# Patient Record
Sex: Male | Born: 1955
Health system: Southern US, Community
[De-identification: ages and names within clinical notes are randomized; demographics above are authoritative.]

## PROBLEM LIST (undated history)

## (undated) DIAGNOSIS — K8020 Calculus of gallbladder without cholecystitis without obstruction: Secondary | ICD-10-CM

## (undated) DIAGNOSIS — E039 Hypothyroidism, unspecified: Secondary | ICD-10-CM

## (undated) DIAGNOSIS — K589 Irritable bowel syndrome without diarrhea: Secondary | ICD-10-CM

## (undated) DIAGNOSIS — R002 Palpitations: Secondary | ICD-10-CM

## (undated) DIAGNOSIS — E7800 Pure hypercholesterolemia, unspecified: Secondary | ICD-10-CM

## (undated) DIAGNOSIS — I493 Ventricular premature depolarization: Secondary | ICD-10-CM

## (undated) DIAGNOSIS — D1800 Hemangioma unspecified site: Secondary | ICD-10-CM

## (undated) DIAGNOSIS — I4891 Unspecified atrial fibrillation: Secondary | ICD-10-CM

## (undated) DIAGNOSIS — M354 Diffuse (eosinophilic) fasciitis: Secondary | ICD-10-CM

## (undated) DIAGNOSIS — H8110 Benign paroxysmal vertigo, unspecified ear: Secondary | ICD-10-CM

## (undated) DIAGNOSIS — K219 Gastro-esophageal reflux disease without esophagitis: Secondary | ICD-10-CM

## (undated) DIAGNOSIS — D7218 Eosinophilia in diseases classified elsewhere: Secondary | ICD-10-CM

## (undated) DIAGNOSIS — R42 Dizziness and giddiness: Secondary | ICD-10-CM

## (undated) HISTORY — DX: Benign paroxysmal vertigo, unspecified ear: H81.10

## (undated) HISTORY — DX: Ventricular premature depolarization: I49.3

## (undated) HISTORY — DX: Pure hypercholesterolemia, unspecified: E78.00

## (undated) HISTORY — DX: Gilbert syndrome: E80.4

## (undated) HISTORY — DX: Hypothyroidism, unspecified: E03.9

## (undated) HISTORY — DX: Palpitations: R00.2

## (undated) HISTORY — DX: Eosinophilia in diseases classified elsewhere: D72.18

## (undated) HISTORY — PX: CHOLECYSTECTOMY: SHX55

## (undated) HISTORY — PX: BACK SURGERY: SHX140

## (undated) HISTORY — DX: Gastro-esophageal reflux disease without esophagitis: K21.9

## (undated) HISTORY — DX: Calculus of gallbladder without cholecystitis without obstruction: K80.20

## (undated) HISTORY — DX: Hemangioma unspecified site: D18.00

## (undated) HISTORY — DX: Unspecified atrial fibrillation: I48.91

## (undated) HISTORY — DX: Irritable bowel syndrome, unspecified: K58.9

## (undated) HISTORY — DX: Diffuse (eosinophilic) fasciitis: M35.4

## (undated) HISTORY — DX: Dizziness and giddiness: R42

## (undated) HISTORY — PX: COLONOSCOPY: SHX174

---

## 1994-01-09 DIAGNOSIS — D1800 Hemangioma unspecified site: Secondary | ICD-10-CM

## 1994-01-09 HISTORY — DX: Hemangioma unspecified site: D18.00

## 1997-06-16 ENCOUNTER — Ambulatory Visit (HOSPITAL_COMMUNITY): Admission: RE | Admit: 1997-06-16 | Discharge: 1997-06-16 | Payer: Self-pay | Admitting: Otolaryngology

## 1997-10-01 ENCOUNTER — Ambulatory Visit (HOSPITAL_COMMUNITY): Admission: RE | Admit: 1997-10-01 | Discharge: 1997-10-01 | Payer: Self-pay | Admitting: *Deleted

## 1998-11-04 ENCOUNTER — Observation Stay (HOSPITAL_COMMUNITY): Admission: RE | Admit: 1998-11-04 | Discharge: 1998-11-06 | Payer: Self-pay | Admitting: Specialist

## 1998-11-04 ENCOUNTER — Encounter (INDEPENDENT_AMBULATORY_CARE_PROVIDER_SITE_OTHER): Payer: Self-pay | Admitting: Specialist

## 1998-11-04 ENCOUNTER — Encounter: Payer: Self-pay | Admitting: Specialist

## 2002-10-24 ENCOUNTER — Ambulatory Visit (HOSPITAL_COMMUNITY): Admission: RE | Admit: 2002-10-24 | Discharge: 2002-10-24 | Payer: Self-pay | Admitting: Otolaryngology

## 2002-10-24 ENCOUNTER — Encounter: Payer: Self-pay | Admitting: Otolaryngology

## 2009-05-27 DIAGNOSIS — E039 Hypothyroidism, unspecified: Secondary | ICD-10-CM | POA: Insufficient documentation

## 2010-01-25 ENCOUNTER — Encounter
Admission: RE | Admit: 2010-01-25 | Discharge: 2010-01-25 | Payer: Self-pay | Source: Home / Self Care | Attending: Otolaryngology | Admitting: Otolaryngology

## 2010-04-12 DIAGNOSIS — H8110 Benign paroxysmal vertigo, unspecified ear: Secondary | ICD-10-CM | POA: Insufficient documentation

## 2011-04-10 ENCOUNTER — Encounter: Payer: Self-pay | Admitting: *Deleted

## 2011-06-20 ENCOUNTER — Encounter: Payer: Self-pay | Admitting: Cardiology

## 2011-08-29 ENCOUNTER — Other Ambulatory Visit: Payer: Self-pay | Admitting: Orthopedic Surgery

## 2011-08-30 ENCOUNTER — Other Ambulatory Visit: Payer: Self-pay | Admitting: Orthopedic Surgery

## 2011-08-31 ENCOUNTER — Other Ambulatory Visit: Payer: Self-pay | Admitting: Orthopedic Surgery

## 2011-12-05 ENCOUNTER — Encounter (INDEPENDENT_AMBULATORY_CARE_PROVIDER_SITE_OTHER): Payer: BC Managed Care – PPO | Admitting: Ophthalmology

## 2011-12-05 DIAGNOSIS — H2510 Age-related nuclear cataract, unspecified eye: Secondary | ICD-10-CM

## 2011-12-05 DIAGNOSIS — H43819 Vitreous degeneration, unspecified eye: Secondary | ICD-10-CM

## 2012-05-29 ENCOUNTER — Encounter (HOSPITAL_COMMUNITY): Payer: Self-pay | Admitting: Family Medicine

## 2012-05-29 ENCOUNTER — Emergency Department (HOSPITAL_COMMUNITY)
Admission: EM | Admit: 2012-05-29 | Discharge: 2012-05-30 | Disposition: A | Discharge status: Home / Self Care | Payer: BC Managed Care – PPO | Attending: Emergency Medicine | Admitting: Emergency Medicine

## 2012-05-29 DIAGNOSIS — Z862 Personal history of diseases of the blood and blood-forming organs and certain disorders involving the immune mechanism: Secondary | ICD-10-CM | POA: Insufficient documentation

## 2012-05-29 DIAGNOSIS — Z8679 Personal history of other diseases of the circulatory system: Secondary | ICD-10-CM | POA: Insufficient documentation

## 2012-05-29 DIAGNOSIS — Z79899 Other long term (current) drug therapy: Secondary | ICD-10-CM | POA: Insufficient documentation

## 2012-05-29 DIAGNOSIS — R509 Fever, unspecified: Secondary | ICD-10-CM

## 2012-05-29 DIAGNOSIS — R51 Headache: Secondary | ICD-10-CM

## 2012-05-29 DIAGNOSIS — E039 Hypothyroidism, unspecified: Secondary | ICD-10-CM | POA: Insufficient documentation

## 2012-05-29 DIAGNOSIS — Z8639 Personal history of other endocrine, nutritional and metabolic disease: Secondary | ICD-10-CM | POA: Insufficient documentation

## 2012-05-29 LAB — GRAM STAIN

## 2012-05-29 LAB — CSF CELL COUNT WITH DIFFERENTIAL: Tube #: 1

## 2012-05-29 LAB — CRYPTOCOCCAL ANTIGEN, CSF

## 2012-05-29 MED ORDER — ONDANSETRON 4 MG PO TBDP
4.0000 mg | ORAL_TABLET | Freq: Once | ORAL | Status: AC
  Administered 2012-05-29: 4 mg via ORAL
  Filled 2012-05-29: qty 1

## 2012-05-29 MED ORDER — KETOROLAC TROMETHAMINE 30 MG/ML IJ SOLN
30.0000 mg | Freq: Once | INTRAMUSCULAR | Status: AC
  Administered 2012-05-29: 30 mg via INTRAVENOUS
  Filled 2012-05-29: qty 1

## 2012-05-29 MED ORDER — NAPROXEN 500 MG PO TABS: 500.0000 mg | ORAL_TABLET | Freq: Two times a day (BID) | ORAL | Status: DC

## 2012-05-29 MED ORDER — ONDANSETRON HCL 4 MG/2ML IJ SOLN
4.0000 mg | Freq: Once | INTRAMUSCULAR | Status: AC
  Administered 2012-05-29: 4 mg via INTRAVENOUS
  Filled 2012-05-29: qty 2

## 2012-05-29 MED ORDER — SODIUM CHLORIDE 0.9 % IV BOLUS (SEPSIS)
1000.0000 mL | Freq: Once | INTRAVENOUS | Status: AC
  Administered 2012-05-29: 1000 mL via INTRAVENOUS

## 2012-05-29 MED ORDER — MORPHINE SULFATE 4 MG/ML IJ SOLN
8.0000 mg | Freq: Once | INTRAMUSCULAR | Status: AC
  Administered 2012-05-29: 8 mg via INTRAVENOUS
  Filled 2012-05-29: qty 2

## 2012-05-29 MED ORDER — METOCLOPRAMIDE HCL 5 MG/ML IJ SOLN
10.0000 mg | Freq: Once | INTRAMUSCULAR | Status: AC
  Administered 2012-05-29: 10 mg via INTRAVENOUS
  Filled 2012-05-29: qty 2

## 2012-05-29 MED ORDER — DIPHENHYDRAMINE HCL 50 MG/ML IJ SOLN
25.0000 mg | Freq: Once | INTRAMUSCULAR | Status: AC
  Administered 2012-05-29: 25 mg via INTRAVENOUS
  Filled 2012-05-29: qty 1

## 2012-05-29 MED ORDER — LIDOCAINE-EPINEPHRINE 2 %-1:100000 IJ SOLN
20.0000 mL | INTRAMUSCULAR | Status: DC
  Filled 2012-05-29: qty 20

## 2012-05-29 MED ORDER — FENTANYL CITRATE 0.05 MG/ML IJ SOLN
100.0000 ug | Freq: Once | INTRAMUSCULAR | Status: AC
  Administered 2012-05-29: 100 ug via INTRAVENOUS
  Filled 2012-05-29: qty 2

## 2012-05-29 NOTE — ED Notes (Signed)
PT spinal fluid samples collected BY DR Hyacinth Meeker and labled BY DR Hyacinth Meeker.

## 2012-05-29 NOTE — ED Notes (Signed)
Per pt and family pt has been having HA, Nausea. sts sent here by doctor to r/o meningitis. Photophobia, fever, joint pain. sts very weak.

## 2012-05-29 NOTE — ED Notes (Signed)
Pt feeling nauseated. Pt states he refusing any med to help with nausea. Pt will sit before discharge to make sure he is feelin better due to the LP he received. Saltine crackers given to help with stomach.

## 2012-05-29 NOTE — ED Provider Notes (Signed)
History     CSN: [id number]  Arrival date & time 05/29/12  2595   First MD Initiated Contact with Patient 05/29/12 1910      Chief Complaint  Patient presents with  . 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 This started approximately 5 days ago and has been gradually worsening, associated with joint pains and diffuse weakness. The patient admits to being an avid outdoors min and last one-approximately one month ago. He denies any obvious insect bites including ticks or mosquitoes and has had no rashes. He denies dysuria, diarrhea, respiratory symptoms or sinus symptoms. His headache is diffuse, burning, associated with photophobia. He has never had any significant headaches in the past. He had laboratory work done at his family doctor's office today with a white blood cell count of 3.9, normal electrolytes and renal function and a CT scan of the head did not show any significant abnormalities. He was sent to the hospital to rule out meningitis.  Patient is a 57 y.o. male presenting with headaches. The history is provided by the patient.  Headache   Past Medical History  Diagnosis Date  . PVC's (premature ventricular contractions)     positional  . Hypothyroidism   . Hypercholesteremia     Past Surgical History  Procedure Laterality Date  . Back surgery  1996 & 1999    Family History  Problem Relation Age of Onset  . Heart disease    . Hypertension    . Heart attack      History  Substance Use Topics  . Smoking status: Never Smoker   . Smokeless tobacco: Not on file  . Alcohol Use: No      Review of Systems  Neurological: Positive for headaches.  All other systems reviewed and are negative.    Allergies  Review of patient's allergies  indicates no known allergies.  Home Medications   Current Outpatient Rx  Name  Route  Sig  Dispense  Refill  . doxycycline (VIBRA-TABS) 100 MG tablet   Oral   Take 100 mg by mouth 2 (two) times daily.         Marland Kitchen ibuprofen (ADVIL,MOTRIN) 200 MG tablet   Oral   Take 600 mg by mouth every 6 (six) hours as needed for pain.         Marland Kitchen levothyroxine (SYNTHROID, LEVOTHROID) 75 MCG tablet   Oral   Take 75 mcg by mouth daily before breakfast.         . pseudoephedrine (SUDAFED) 30 MG tablet   Oral   Take 30 mg by mouth every 4 (four) hours as needed for congestion.         . naproxen (NAPROSYN) 500 MG tablet   Oral   Take 1 tablet (500 mg total) by mouth 2 (two) times daily with a meal.   30 tablet   0     BP 143/87  Pulse 83  Temp(Src) 99.9 F (37.7 C)  Resp 9  SpO2 94%  Physical Exam  Nursing note and vitals reviewed. Constitutional: He appears well-developed and well-nourished.  Uncomfortable appearing  HENT:  Head: Normocephalic and atraumatic.  Mouth/Throat: Oropharynx is clear and moist. No oropharyngeal exudate.  Oropharynx is clear, nasal passages are clear, tympanic membranes are clear.  Sinuses are nontender  Eyes: Conjunctivae and EOM are normal. Pupils  are equal, round, and reactive to light. Right eye exhibits no discharge. Left eye exhibits no discharge. No scleral icterus.  Neck: Normal range of motion. No JVD present. No thyromegaly present.  Pain with range of motion but able to look over each shoulder. No pain with flexion  Cardiovascular: Normal rate, regular rhythm, normal heart sounds and intact distal pulses.  Exam reveals no gallop and no friction rub.   No murmur heard. Pulmonary/Chest: Effort normal and breath sounds normal. No respiratory distress. He has no wheezes. He has no rales.  Abdominal: Soft. Bowel sounds are normal. He exhibits no distension and no mass. There is no tenderness.  Musculoskeletal: Normal range of motion. He exhibits  no edema and no tenderness.  Lymphadenopathy:    He has no cervical adenopathy.  Neurological: He is alert. Coordination normal.  Mental status is normal, speech is clear, gait is normal, movements are intact with normal coordination and no ataxia  Skin: Skin is warm and dry. No rash noted. No erythema.  Psychiatric: He has a normal mood and affect. His behavior is normal.    ED Course  LUMBAR PUNCTURE Date/Time: 05/29/2012 8:41 PM Performed by: Eber Hong D Authorized by: Eber Hong D Consent: Verbal consent obtained. written consent obtained. Risks and benefits: risks, benefits and alternatives were discussed Consent given by: patient Patient understanding: patient states understanding of the procedure being performed Patient consent: the patient's understanding of the procedure matches consent given Procedure consent: procedure consent matches procedure scheduled Relevant documents: relevant documents present and verified Test results: test results available and properly labeled Site marked: the operative site was marked Imaging studies: imaging studies available Required items: required blood products, implants, devices, and special equipment available Patient identity confirmed: verbally with patient Time out: Immediately prior to procedure a "time out" was called to verify the correct patient, procedure, equipment, support staff and site/side marked as required. Indications: evaluation for infection Anesthesia: local infiltration Local anesthetic: lidocaine 2% with epinephrine Anesthetic total: 6 ml Patient sedated: no Preparation: Patient was prepped and draped in the usual sterile fashion. Lumbar space: L4-L5 interspace Patient's position: left lateral decubitus Needle gauge: 22 Needle type: spinal needle - Quincke tip Needle length: 3.5 in Number of attempts: 1 Fluid appearance: clear Tubes of fluid: 4 Total volume: 4 ml Post-procedure: site cleaned and  adhesive bandage applied Patient tolerance: Patient tolerated the procedure well with no immediate complications.   (including critical care time)  Labs Reviewed  CSF CELL COUNT WITH DIFFERENTIAL - Abnormal; Notable for the following:    RBC Count, CSF 14 (*)    All other components within normal limits  GRAM STAIN  CSF CULTURE  CRYPTOCOCCAL ANTIGEN, CSF  PROTEIN AND GLUCOSE, CSF  ARBOVIRUS IGG, CSF  HERPES SIMPLEX VIRUS(HSV) DNA BY PCR   No results found.   1. Headache   2. Fever       MDM  At this time the patient has a normal neurologic exam, normal HEENT exam, vital signs are normal, temperature 99.9 degrees, no tachycardia. He does appear uncomfortable and will require spinal tap for further evaluation. Titers of rickettsial diseases were ordered at his family doctor's office and drawn already. He was prescribed Vicodin and doxycycline, he has not had these yet.   Lumbar puncture performed, testing not consistent with meningitis, patient appears stable for discharge after receiving intravenous medications and significant improvement. The patient will be discharged and informed that he needs to continue his doxycycline, Naprosyn given as a  prescription to use in addition to his hydrocodone, spouse expresses understanding.   Meds given in ED:  Medications  lidocaine-EPINEPHrine (XYLOCAINE W/EPI) 2 %-1:100000 (with pres) injection 20 mL (not administered)  fentaNYL (SUBLIMAZE) injection 100 mcg (100 mcg Intravenous Given 05/29/12 2035)  ondansetron (ZOFRAN) injection 4 mg (4 mg Intravenous Given 05/29/12 2032)  diphenhydrAMINE (BENADRYL) injection 25 mg (25 mg Intravenous Given 05/29/12 2140)  morphine 4 MG/ML injection 8 mg (8 mg Intravenous Given 05/29/12 2140)  ondansetron (ZOFRAN-ODT) disintegrating tablet 4 mg (4 mg Oral Given 05/29/12 2140)  sodium chloride 0.9 % bolus 1,000 mL (1,000 mLs Intravenous New Bag/Given 05/29/12 2142)  ketorolac (TORADOL) 30 MG/ML injection 30  mg (30 mg Intravenous Given 05/29/12 2243)  metoCLOPramide (REGLAN) injection 10 mg (10 mg Intravenous Given 05/29/12 2243)    New Prescriptions   NAPROXEN (NAPROSYN) 500 MG TABLET    Take 1 tablet (500 mg total) by mouth 2 (two) times daily with a meal.      Vida Roller, MD 05/29/12 2344

## 2012-05-30 LAB — HERPES SIMPLEX VIRUS(HSV) DNA BY PCR
HSV 1 DNA: NOT DETECTED
HSV 2 DNA: NOT DETECTED

## 2012-05-30 NOTE — ED Notes (Signed)
Pt states he is feeling better after vomitingx1. Pt states he is ready to be discharged. Wife at bedside. Pt stable

## 2012-05-31 LAB — ARBOVIRUS IGG, CSF
Eastern eq encephalitis, IgG: <1:2 {titer}
Lacrosse virus encephalitis, IgG: <1:2 {titer}
St Louis encephalitis, IgG: <1:2 {titer}
Western eq encephalitis, IgG: <1:2 {titer}

## 2012-06-02 LAB — CSF CULTURE W GRAM STAIN

## 2013-09-23 ENCOUNTER — Encounter: Payer: Self-pay | Admitting: Cardiology

## 2014-01-09 DIAGNOSIS — K8020 Calculus of gallbladder without cholecystitis without obstruction: Secondary | ICD-10-CM

## 2014-01-09 HISTORY — DX: Calculus of gallbladder without cholecystitis without obstruction: K80.20

## 2014-06-19 DIAGNOSIS — R109 Unspecified abdominal pain: Secondary | ICD-10-CM | POA: Insufficient documentation

## 2014-10-27 DIAGNOSIS — R197 Diarrhea, unspecified: Secondary | ICD-10-CM | POA: Insufficient documentation

## 2015-02-04 MED FILL — LEVOTHYROXINE 75 MCG TABLET: 75 | 30 days supply | Qty: 30 | Fill #0

## 2015-03-09 MED FILL — LEVOTHYROXINE 75 MCG TABLET: 75 | 30 days supply | Qty: 30 | Fill #1

## 2015-04-13 MED FILL — LEVOTHYROXINE 75 MCG TABLET: 75 | 30 days supply | Qty: 30 | Fill #2

## 2015-04-20 MED FILL — DOXYCYCLINE HYCLATE 100 MG: 100 | 7 days supply | Qty: 14 | Fill #0

## 2015-05-11 MED FILL — LEVOTHYROXINE 75 MCG TABLET: 75 | 30 days supply | Qty: 30 | Fill #3

## 2015-06-10 MED FILL — LEVOTHYROXINE 75 MCG TABLET: 75 | 30 days supply | Qty: 30 | Fill #4

## 2015-07-12 MED FILL — LEVOTHYROXINE 75 MCG TABLET: 75 | 30 days supply | Qty: 30 | Fill #5

## 2015-08-10 MED FILL — LEVOTHYROXINE 75 MCG TABLET: 75 | 30 days supply | Qty: 30 | Fill #6

## 2015-09-09 MED FILL — LEVOTHYROXINE 75 MCG TABLET: 75 | 30 days supply | Qty: 30 | Fill #7

## 2015-10-08 MED FILL — LEVOTHYROXINE 75 MCG TABLET: 75 | 30 days supply | Qty: 30 | Fill #8

## 2015-11-09 MED FILL — LEVOTHYROXINE 75 MCG TABLET: 75 | 30 days supply | Qty: 30 | Fill #9

## 2015-12-09 MED FILL — LEVOTHYROXINE 75 MCG TABLET: 75 | 30 days supply | Qty: 30 | Fill #10

## 2016-01-07 MED FILL — LEVOTHYROXINE 75 MCG TABLET: 75 | 30 days supply | Qty: 30 | Fill #11

## 2016-02-07 MED FILL — LEVOTHYROXINE 75 MCG TABLET: 75 | 31 days supply | Qty: 31 | Fill #0

## 2016-03-09 MED FILL — LEVOTHYROXINE 75 MCG TABLET: 75 | 31 days supply | Qty: 31 | Fill #1

## 2016-04-11 MED FILL — LEVOTHYROXINE 75 MCG TABLET: 75 | 28 days supply | Qty: 28 | Fill #2

## 2016-05-08 MED FILL — LEVOTHYROXINE 75 MCG TABLET: 75 | 31 days supply | Qty: 31 | Fill #0

## 2016-06-09 MED FILL — LEVOTHYROXINE 75 MCG TABLET: 75 | 31 days supply | Qty: 31 | Fill #1

## 2016-07-10 MED FILL — LEVOTHYROXINE 75 MCG TABLET: 75 | 28 days supply | Qty: 28 | Fill #2

## 2016-07-17 DIAGNOSIS — Z125 Encounter for screening for malignant neoplasm of prostate: Secondary | ICD-10-CM | POA: Diagnosis not present

## 2016-07-17 DIAGNOSIS — E038 Other specified hypothyroidism: Secondary | ICD-10-CM | POA: Diagnosis not present

## 2016-07-17 DIAGNOSIS — Z0000 Encounter for general adult medical examination without abnormal findings: Secondary | ICD-10-CM | POA: Diagnosis not present

## 2016-07-24 DIAGNOSIS — R197 Diarrhea, unspecified: Secondary | ICD-10-CM | POA: Diagnosis not present

## 2016-07-24 DIAGNOSIS — Z0000 Encounter for general adult medical examination without abnormal findings: Secondary | ICD-10-CM | POA: Diagnosis not present

## 2016-07-24 DIAGNOSIS — E038 Other specified hypothyroidism: Secondary | ICD-10-CM | POA: Diagnosis not present

## 2016-07-24 DIAGNOSIS — Z1389 Encounter for screening for other disorder: Secondary | ICD-10-CM | POA: Diagnosis not present

## 2016-07-24 DIAGNOSIS — M546 Pain in thoracic spine: Secondary | ICD-10-CM | POA: Diagnosis not present

## 2016-08-11 MED FILL — LEVOTHYROXINE 88 MCG TABLET: 88 | 30 days supply | Qty: 30 | Fill #0

## 2016-09-08 MED FILL — LEVOTHYROXINE 88 MCG TABLET: 88 | 30 days supply | Qty: 30 | Fill #1

## 2016-10-09 MED FILL — LEVOTHYROXINE 88 MCG TABLET: 88 | 30 days supply | Qty: 30 | Fill #2

## 2016-11-07 DIAGNOSIS — Z23 Encounter for immunization: Secondary | ICD-10-CM | POA: Diagnosis not present

## 2016-11-07 MED FILL — LEVOTHYROXINE 88 MCG TABLET: 88 | 30 days supply | Qty: 30 | Fill #3

## 2016-12-07 MED FILL — LEVOTHYROXINE 88 MCG TABLET: 88 | 30 days supply | Qty: 30 | Fill #4

## 2016-12-19 DIAGNOSIS — D225 Melanocytic nevi of trunk: Secondary | ICD-10-CM | POA: Diagnosis not present

## 2016-12-19 DIAGNOSIS — Z1283 Encounter for screening for malignant neoplasm of skin: Secondary | ICD-10-CM | POA: Diagnosis not present

## 2017-01-05 MED FILL — LEVOTHYROXINE 88 MCG TABLET: 88 | 30 days supply | Qty: 30 | Fill #5

## 2017-02-06 MED FILL — LEVOTHYROXINE 88 MCG TABLET: 88 | 30 days supply | Qty: 30 | Fill #6

## 2017-03-07 MED FILL — LEVOTHYROXINE 88 MCG TABLET: 88 | 30 days supply | Qty: 30 | Fill #7

## 2017-04-06 MED FILL — LEVOTHYROXINE 88 MCG TABLET: 88 | 30 days supply | Qty: 30 | Fill #8

## 2017-05-10 MED FILL — LEVOTHYROXINE 88 MCG TABLET: 88 | 30 days supply | Qty: 30 | Fill #9

## 2017-06-07 MED FILL — LEVOTHYROXINE 88 MCG TABLET: 88 | 30 days supply | Qty: 30 | Fill #10

## 2017-07-09 MED FILL — LEVOTHYROXINE 88 MCG TABLET: 88 | 30 days supply | Qty: 30 | Fill #11

## 2017-08-08 MED FILL — LEVOTHYROXINE 88 MCG TABLET: 88 | 30 days supply | Qty: 30 | Fill #0

## 2017-09-13 MED FILL — LEVOTHYROXINE 88 MCG TABLET: 88 | 30 days supply | Qty: 30 | Fill #1

## 2017-10-15 MED FILL — LEVOTHYROXINE 88 MCG TABLET: 88 | 30 days supply | Qty: 30 | Fill #2

## 2017-10-17 DIAGNOSIS — K591 Functional diarrhea: Secondary | ICD-10-CM | POA: Diagnosis not present

## 2017-11-05 DIAGNOSIS — H43812 Vitreous degeneration, left eye: Secondary | ICD-10-CM | POA: Diagnosis not present

## 2017-11-07 DIAGNOSIS — R197 Diarrhea, unspecified: Secondary | ICD-10-CM | POA: Diagnosis not present

## 2017-11-07 DIAGNOSIS — E038 Other specified hypothyroidism: Secondary | ICD-10-CM | POA: Diagnosis not present

## 2017-11-07 DIAGNOSIS — Z1389 Encounter for screening for other disorder: Secondary | ICD-10-CM | POA: Diagnosis not present

## 2017-11-07 DIAGNOSIS — R002 Palpitations: Secondary | ICD-10-CM | POA: Diagnosis not present

## 2017-11-12 MED FILL — LEVOTHYROXINE 88 MCG TABLET: 88 | 30 days supply | Qty: 30 | Fill #3

## 2017-12-12 MED FILL — LEVOTHYROXINE 88 MCG TABLET: 88 | 30 days supply | Qty: 30 | Fill #0

## 2018-01-15 MED FILL — LEVOTHYROXINE 88 MCG TABLET: 88 | 30 days supply | Qty: 30 | Fill #1

## 2018-02-15 MED FILL — LEVOTHYROXINE 88 MCG TABLET: 88 | 30 days supply | Qty: 30 | Fill #2

## 2018-03-18 MED FILL — LEVOTHYROXINE 88 MCG TABLET: 88 | 30 days supply | Qty: 30 | Fill #3 | Status: TO

## 2018-04-01 MED FILL — LEVOTHYROXINE 88 MCG TABLET: 88 | 30 days supply | Qty: 30 | Fill #0

## 2018-05-08 MED FILL — LEVOTHYROXINE 88 MCG TABLET: 88 | 30 days supply | Qty: 30 | Fill #1 | Status: TO

## 2018-05-28 DIAGNOSIS — R002 Palpitations: Secondary | ICD-10-CM | POA: Diagnosis not present

## 2018-05-29 ENCOUNTER — Telehealth: Payer: Self-pay | Admitting: *Deleted

## 2018-05-29 NOTE — Progress Notes (Signed)
Cardiology Office Note:    Date:  05/30/2018   ID:  David Mclaughlin, DOB 1955/03/14, MRN [id number]  PCP:  Prince Solian, MD  Cardiologist:  Jacquline Terrill  Electrophysiologist:  None   Referring MD: No ref. provider found , [place]   Chief Complaint  Patient presents with  . Palpitations      History of Present Illness:    David Mclaughlin is a 63 y.o. male with a hx of  Palpitations.  Has had some additional fatigue for the past month.   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   This weekend, lots of pruning shrubs, played golf, active all day on Saturday . Stays hydrated.    On Sunday, had several seconds of palpitations .  Not associated with pain, no dysnea, no dizziness.   Over the past several days,  Symptoms have improved.   But not completely resolved.   He does 150 - 200 pushups a day . Hikes frequently .  ECG from [facility]'s office showed some LVH but otherwise no issues .   Father had MI at age 75 ( smoked 2 ppd ) lived to age 54.    Has the palpitations several times an hour    Past Medical History:  Diagnosis Date  . A-fib ([place])   . BPPV (benign paroxysmal positional vertigo)   . Cavernous angioma 1996  . Dizziness   . Gallstones 2016  . Hypercholesteremia   . Hypothyroidism   . IBS (irritable bowel syndrome)   . Palpitations   . PVC's (premature ventricular contractions)    positional  . Shulman's syndrome     Past Surgical History:  Procedure Laterality Date  . BACK SURGERY  1996 & 1999    Current Medications: Current Meds  Medication Sig  . Coenzyme Q10 (CO Q 10) 100 MG CAPS Take 1 capsule by mouth daily.  Marland Kitchen ibuprofen (ADVIL,MOTRIN) 200 MG tablet Take 600 mg by mouth every 6 (six) hours as needed for pain.  Marland Kitchen KRILL OIL PO Take 1 tablet by mouth daily.  Marland Kitchen levothyroxine (SYNTHROID) 88 MCG tablet Take 1 tablet by mouth daily.  . pseudoephedrine (SUDAFED) 30 MG tablet Take 30 mg by mouth every 4 (four) hours as needed for  congestion.  . [DISCONTINUED] levothyroxine (SYNTHROID, LEVOTHROID) 75 MCG tablet Take 75 mcg by mouth daily before breakfast.     Allergies:   Patient has no known allergies.   Social History   Socioeconomic History  . Marital status: Single    Spouse name: Not on file  . Number of children: 3  . Years of education: Not on file  . Highest education level: Not on file  Occupational History  . Occupation: Librarian, academic  Social Needs  . Financial resource strain: Not on file  . Food insecurity:    Worry: Not on file    Inability: Not on file  . Transportation needs:    Medical: Not on file    Non-medical: Not on file  Tobacco Use  . Smoking status: Never Smoker  . Smokeless tobacco: Never Used  Substance and Sexual Activity  . Alcohol use: No  . Drug use: No  . Sexual activity: Yes  Lifestyle  . Physical activity:    Days per week: Not on file    Minutes per session: Not on file  . Stress: Not on file  Relationships  . Social connections:    Talks on phone: Not on  file    Gets together: Not on file    Attends religious service: Not on file    Active member of club or organization: Not on file    Attends meetings of clubs or organizations: Not on file    Relationship status: Not on file  Other Topics Concern  . Not on file  Social History Narrative  . Not on file     Family History: The patient's family history includes Heart attack in an other family member; Heart disease in an other family member; Hypertension in an other family member.  ROS:   Please see the history of present illness.     All other systems reviewed and are negative.  EKGs/Labs/Other Studies Reviewed:    The following studies were reviewed today:   EKG:  EKG is  ordered today.  The ekg ordered today demonstrates   Recent Labs: No results found for requested labs within last 8760 hours.  Recent Lipid Panel No results found for: CHOL, TRIG, HDL, CHOLHDL, VLDL, LDLCALC, LDLDIRECT   Physical Exam:    VS:  BP 122/86   Pulse 75   Ht 5\' 9"  (1.753 m)   Wt 168 lb 6.4 oz (76.4 kg)   SpO2 97%   BMI 24.87 kg/m     Wt Readings from Last 3 Encounters:  05/30/18 168 lb 6.4 oz (76.4 kg)     GEN:  Well nourished, well developed in no acute distress,  Thin middle age man HEENT: Normal NECK: No JVD; No carotid bruits LYMPHATICS: No lymphadenopathy CARDIAC: RRR, no murmurs, prominent S2  RESPIRATORY:  Clear to auscultation without rales, wheezing or rhonchi  ABDOMEN: Soft, non-tender, non-distended MUSCULOSKELETAL:  No edema; No deformity  SKIN: Warm and dry NEUROLOGIC:  Alert and oriented x 3 PSYCHIATRIC:  Normal affect   ASSESSMENT:    1. Palpitations    PLAN:    In order of problems listed above:  1. Palpitations:   David presents with palpitations .  He has had these palpitations since he had an extremely busy and vigorous day this past Saturday.  The symptoms started on Sunday.  Clinically, it sounds like he may have electrolyte or volume depletion.   He may have a low potassium.   He symptoms have been gradually improving since that time but he still has palpitations at least several times an hour.  He is not had any episodes of chest pain or shortness of breath.  They do not cause dizziness.  We will place a 3-day event monitor.  He states that he has some off and off that that should catch any arrhythmias.  We will get recent labs from [facility].  I have encouraged him to drink V8 juice once a day for the next several days to see if that helps.  I will follow-up with him in 4 to 6 weeks for a follow-up office visit.   Medication Adjustments/Labs and Tests Ordered: Current medicines are reviewed at length with the patient today.  Concerns regarding medicines are outlined above.  Orders Placed This Encounter  Procedures  . LONG TERM MONITOR-LIVE TELEMETRY (3-14 DAYS)  . EKG 12-Lead   No orders of the defined types were placed in this  encounter.   Patient Instructions  Medication Instructions:  Your physician recommends that you continue on your current medications as directed. Please refer to the Current Medication list given to you today.  If you need a refill on your cardiac medications before your next  appointment, please call your pharmacy.   Lab work: None Ordered   Testing/Procedures: Your physician has recommended that you wear an event monitor. Event monitors are medical devices that record the heart's electrical activity. Doctors most often [place] these monitors to diagnose arrhythmias. Arrhythmias are problems with the speed or rhythm of the heartbeat. The monitor is a small, portable device. You can wear one while you do your normal daily activities. This is usually used to diagnose what is causing palpitations/syncope (passing out).    Follow-Up: Increase your intake of fluids (water with electrolyte tabs like Nun tablets, or gatorade) , protein ( hard boiled eggs, chicken, fish) , and a electrolytes ( V-8 juice, salt, potassium chloride  which is sold as No-Salt   Your physician recommends that you have a virtual follow-up appointment on Thursday July 9 at 8:40 am with Dr. Acie Fredrickson We will use [email or web address] for your visit      Signed, Mertie Moores, MD  05/30/2018 2:43 PM    [place]

## 2018-05-29 NOTE — Telephone Encounter (Signed)
    COVID-19 Pre-Screening Questions:  . In the past 7 to 10 days have you had a cough,  shortness of breath, headache, congestion, fever, body aches, chills, sore throat, or sudden loss of taste or sense of smell? NO . Have you been around anyone with known Covid 19. NO . Have you been around anyone who is awaiting Covid 19 test results in the past 7 to 10 days? NO . Have you been around anyone who has been exposed to Covid 19, or has mentioned symptoms of Covid 19 within the past 7 to 10 days? NO  IReceived message from Dr. Acie Fredrickson pt needed new pt appointment tomorrow at 2 PM with him.  Cell number for pt is [phone]. I spoke with pt and gave him appointment information.  He will arrive 15 minutes prior to appt.  Pt aware to wear mask and to contact office if any change if condition prior to appt.

## 2018-05-30 ENCOUNTER — Ambulatory Visit (INDEPENDENT_AMBULATORY_CARE_PROVIDER_SITE_OTHER): Payer: 59 | Admitting: Cardiovascular Disease

## 2018-05-30 ENCOUNTER — Encounter (INDEPENDENT_AMBULATORY_CARE_PROVIDER_SITE_OTHER): Payer: Self-pay

## 2018-05-30 ENCOUNTER — Other Ambulatory Visit: Payer: Self-pay

## 2018-05-30 ENCOUNTER — Encounter: Payer: Self-pay | Admitting: Cardiovascular Disease

## 2018-05-30 VITALS — BP 122/86 | HR 75 | Ht 69.0 in | Wt 168.4 lb

## 2018-05-30 DIAGNOSIS — R002 Palpitations: Secondary | ICD-10-CM | POA: Diagnosis not present

## 2018-05-30 DIAGNOSIS — Z7189 Other specified counseling: Secondary | ICD-10-CM | POA: Diagnosis not present

## 2018-05-30 NOTE — Patient Instructions (Addendum)
Medication Instructions:  Your physician recommends that you continue on your current medications as directed. Please refer to the Current Medication list given to you today.  If you need a refill on your cardiac medications before your next appointment, please call your pharmacy.   Lab work: None Ordered   Testing/Procedures: Your physician has recommended that you wear an event monitor. Event monitors are medical devices that record the heart's electrical activity. Doctors most often [place] these monitors to diagnose arrhythmias. Arrhythmias are problems with the speed or rhythm of the heartbeat. The monitor is a small, portable device. You can wear one while you do your normal daily activities. This is usually used to diagnose what is causing palpitations/syncope (passing out).    Follow-Up: Increase your intake of fluids (water with electrolyte tabs like Nun tablets, or gatorade) , protein ( hard boiled eggs, chicken, fish) , and a electrolytes ( V-8 juice, salt, potassium chloride  which is sold as No-Salt   Your physician recommends that you have a virtual follow-up appointment on Thursday July 9 at 8:40 am with Dr. Acie Fredrickson We will use [email or web address] for your visit

## 2018-06-07 MED FILL — LEVOTHYROXINE 88 MCG TABLET: 88 | 30 days supply | Qty: 30 | Fill #0

## 2018-06-11 ENCOUNTER — Telehealth: Payer: Self-pay | Admitting: Radiology

## 2018-06-11 NOTE — Telephone Encounter (Signed)
Called patient to go over the monitor Dr. Acie Fredrickson ordered. Patient states palpitations have completely stopped. He would prefer to cancel at this time and will call back if they return.

## 2018-07-09 ENCOUNTER — Telehealth: Payer: Self-pay

## 2018-07-09 MED FILL — LEVOTHYROXINE 88 MCG TABLET: 88 | 30 days supply | Qty: 30 | Fill #1

## 2018-07-09 NOTE — Telephone Encounter (Signed)
Spoke with pt and he would like to cancel his upcoming appt. Pt states that he is asymptomatic and is not having any issues. Pt also states that he will give [place] a call if any symptoms do arise or he begins to feel poorly. Advised pt to reach out to [place] if anything does change and we will get him seen.

## 2018-07-18 ENCOUNTER — Telehealth: Payer: 59 | Admitting: Cardiovascular Disease

## 2018-08-14 MED FILL — LEVOTHYROXINE 88 MCG TABLET: 88 | 30 days supply | Qty: 30 | Fill #2

## 2018-09-09 MED FILL — LEVOTHYROXINE 88 MCG TABLET: 88 | 30 days supply | Qty: 30 | Fill #3

## 2018-10-14 MED FILL — LEVOTHYROXINE 88 MCG TABLET: 88 | 30 days supply | Qty: 30 | Fill #4

## 2018-12-31 DIAGNOSIS — R634 Abnormal weight loss: Secondary | ICD-10-CM | POA: Insufficient documentation

## 2018-12-31 DIAGNOSIS — R194 Change in bowel habit: Secondary | ICD-10-CM | POA: Insufficient documentation

## 2019-01-01 ENCOUNTER — Other Ambulatory Visit: Payer: Self-pay | Admitting: Internal Medicine

## 2019-01-01 DIAGNOSIS — R194 Change in bowel habit: Secondary | ICD-10-CM

## 2019-01-01 DIAGNOSIS — R634 Abnormal weight loss: Secondary | ICD-10-CM

## 2019-02-07 ENCOUNTER — Other Ambulatory Visit: Payer: 59

## 2019-03-24 ENCOUNTER — Other Ambulatory Visit: Payer: Self-pay

## 2019-03-24 ENCOUNTER — Ambulatory Visit (INDEPENDENT_AMBULATORY_CARE_PROVIDER_SITE_OTHER)
Admission: RE | Admit: 2019-03-24 | Discharge: 2019-03-24 | Disposition: A | Discharge status: Home / Self Care | Payer: Self-pay | Source: Ambulatory Visit | Attending: Nurse Practitioner | Admitting: Nurse Practitioner

## 2019-03-24 ENCOUNTER — Telehealth: Payer: Self-pay | Admitting: Nurse Practitioner

## 2019-03-24 ENCOUNTER — Encounter (INDEPENDENT_AMBULATORY_CARE_PROVIDER_SITE_OTHER): Payer: Self-pay

## 2019-03-24 DIAGNOSIS — Z7189 Other specified counseling: Secondary | ICD-10-CM

## 2019-03-24 NOTE — Telephone Encounter (Signed)
David Mclaughlin from [place] is aware and will call pt and schedule.

## 2019-03-24 NOTE — Telephone Encounter (Signed)
David Mclaughlin reached out to me last week for advice regarding cardiovascular risk factor modification/HLD, etc. He is interested in calcium scoring.   Order placed for calcium scoring. He is aware of the out of pocket expense.   David Junes, RN, [place] [address] [place], [place]  [address] [phone]

## 2019-11-27 ENCOUNTER — Telehealth: Payer: Self-pay | Admitting: Internal Medicine

## 2019-11-27 NOTE — Telephone Encounter (Signed)
Patient spoke to me  about scheduling a colonoscopy (think for screening)  Please contact him and offer pre-visit and appt - he is looking for December  I think its ok for direct as best I know  Let me know if any ?  Thanks!

## 2019-11-28 NOTE — Telephone Encounter (Signed)
I texted him so let's see if that works

## 2019-12-01 NOTE — Telephone Encounter (Signed)
I have also texted him and left message that way so we will wait for him to return communication

## 2019-12-01 NOTE — Telephone Encounter (Signed)
Hi Dr. Carlean Purl, I called pt again to schedule. However, his voice mail was still full.

## 2020-01-12 ENCOUNTER — Encounter: Payer: Self-pay | Admitting: Internal Medicine

## 2020-03-09 DIAGNOSIS — Z860101 Personal history of adenomatous and serrated colon polyps: Secondary | ICD-10-CM

## 2020-03-09 DIAGNOSIS — Z86010 Personal history of colonic polyps: Secondary | ICD-10-CM

## 2020-03-09 HISTORY — DX: Personal history of colonic polyps: Z86.010

## 2020-03-09 HISTORY — DX: Personal history of adenomatous and serrated colon polyps: Z86.0101

## 2020-03-12 ENCOUNTER — Other Ambulatory Visit: Payer: Self-pay

## 2020-03-12 ENCOUNTER — Ambulatory Visit (AMBULATORY_SURGERY_CENTER): Payer: PRIVATE HEALTH INSURANCE | Admitting: *Deleted

## 2020-03-12 VITALS — Ht 69.0 in | Wt 161.0 lb

## 2020-03-12 DIAGNOSIS — Z1211 Encounter for screening for malignant neoplasm of colon: Secondary | ICD-10-CM

## 2020-03-12 NOTE — Progress Notes (Signed)

## 2020-03-15 ENCOUNTER — Encounter: Payer: Self-pay | Admitting: Internal Medicine

## 2020-03-29 ENCOUNTER — Ambulatory Visit (AMBULATORY_SURGERY_CENTER): Payer: PRIVATE HEALTH INSURANCE | Admitting: Internal Medicine

## 2020-03-29 ENCOUNTER — Other Ambulatory Visit: Payer: Self-pay

## 2020-03-29 ENCOUNTER — Encounter: Payer: Self-pay | Admitting: Internal Medicine

## 2020-03-29 VITALS — BP 113/80 | HR 55 | Temp 97.9°F | Resp 17 | Ht 69.0 in | Wt 161.0 lb

## 2020-03-29 DIAGNOSIS — D123 Benign neoplasm of transverse colon: Secondary | ICD-10-CM

## 2020-03-29 DIAGNOSIS — Z1211 Encounter for screening for malignant neoplasm of colon: Secondary | ICD-10-CM

## 2020-03-29 MED ORDER — SODIUM CHLORIDE 0.9 % IV SOLN: 500.0000 mL | Freq: Once | INTRAVENOUS | Status: DC

## 2020-03-29 NOTE — Op Note (Addendum)
[name]: David Mclaughlin Procedure Date: 03/29/2020 9:29 AM MRN: [id number] Endoscopist: Gatha Mayer , MD Age: 65 Referring MD:  Date of Birth: 12/05/1955 Gender: Male Account #: [id number] Procedure:                Colonoscopy Indications:              Screening for colorectal malignant neoplasm Medicines:                Propofol per Anesthesia, Monitored Anesthesia Care Procedure:                Pre-Anesthesia Assessment:                           - Prior to the procedure, a History and Physical                            was performed, and patient medications and                            allergies were reviewed. The patient's tolerance of                            previous anesthesia was also reviewed. The risks                            and benefits of the procedure and the sedation                            options and risks were discussed with the patient.                            All questions were answered, and informed consent                            was obtained. Prior Anticoagulants: The patient has                            taken no previous anticoagulant or antiplatelet                            agents. ASA Grade Assessment: II - A patient with                            mild systemic disease. After reviewing the risks                            and benefits, the patient was deemed in                            satisfactory condition to undergo the procedure.                           After obtaining informed consent, the colonoscope  was passed under direct vision. Throughout the                            procedure, the patient's blood pressure, pulse, and                            oxygen saturations were monitored continuously. The                            Olympus CF-[id number]) [id number] was introduced                            through the anus and advanced to the the cecum,                             identified by appendiceal orifice and ileocecal                            valve. The colonoscopy was performed without                            difficulty. The patient tolerated the procedure                            well. The quality of the bowel preparation was                            excellent. The ileocecal valve, appendiceal                            orifice, and rectum were photographed. The bowel                            preparation used was Miralax via split dose                            instruction. Scope In: 9:43:27 AM Scope Out: 10:07:15 AM Scope Withdrawal Time: 0 hours 18 minutes 40 seconds  Total Procedure Duration: 0 hours 23 minutes 48 seconds  Findings:                 The perianal and digital rectal examinations were                            normal. Pertinent negatives include normal prostate                            (size, shape, and consistency).                           Two sessile polyps were found in the transverse                            colon. The polyps were 1 to 2 mm in size. These  polyps were removed with a cold snare. Resection                            was complete, but the polyp tissue was only                            partially retrieved. Verification of patient                            identification for the specimen was done. Estimated                            blood loss was minimal.                           Internal hemorrhoids were found.                           The exam was otherwise without abnormality on                            direct and retroflexion views. Complications:            No immediate complications. Estimated Blood Loss:     Estimated blood loss was minimal. Impression:               - Two 1 to 2 mm polyps in the transverse colon,                            removed with a cold snare. Complete resection.                            Partial retrieval.                            - Internal hemorrhoids.                           - The examination was otherwise normal on direct                            and retroflexion views. Recommendation:           - Patient has a contact number available for                            emergencies. The signs and symptoms of potential                            delayed complications were discussed with the                            patient. Return to normal activities tomorrow.                            Written discharge instructions were provided to the  patient.                           - Resume previous diet.                           - Continue present medications.                           - Repeat colonoscopy is recommended. The                            colonoscopy date will be determined after pathology                            results from today's exam become available for                            review.                           Sister has metastatic neuroendocrine tumor of gut -                            she also had ?FAP but noone else does - learned                            about FAP in recovery - will clarify with him after                            pathology in Gatha Mayer, MD 03/29/2020 10:14:37 AM This report has been signed electronically.

## 2020-03-29 NOTE — Progress Notes (Signed)
Patient very drowsy and states dizziness. Held longer in recovery room until feelings pass.  Wife present.

## 2020-03-29 NOTE — Progress Notes (Signed)
PT taken to PACU. Monitors in place. VSS. Report given to RN. 

## 2020-03-29 NOTE — Progress Notes (Signed)
Called to room to assist during endoscopic procedure.  Patient ID and intended procedure confirmed with present staff. Received instructions for my participation in the procedure from the performing physician.  

## 2020-03-29 NOTE — Progress Notes (Signed)
Pt's states no medical or surgical changes since previsit or office visit.  Check-in-JB  Vital Signs-CW 

## 2020-03-29 NOTE — Patient Instructions (Addendum)
I found and removed two tiny polyps 2 mm at most in size - one was not retrieved probably destroyed by suctioning. Even if pre-cancerous they were tiny and removed so I suspect a 10 year follow-up will likely be ok.  Let me see the pathology on the one we retrieved and will let you know.  Your hemorrhoids were mildly swollen - as we often see after colonoscopy prep.  .[email or web address]  Gatha Mayer, MD, FACG  YOU HAD AN ENDOSCOPIC PROCEDURE TODAY AT [facility]:   Refer to the procedure report that was given to you for any specific questions about what was found during the examination.  If the procedure report does not answer your questions, please call your gastroenterologist to clarify.  If you requested that your care partner not be given the details of your procedure findings, then the procedure report has been included in a sealed envelope for you to review at your convenience later.  YOU SHOULD EXPECT: Some feelings of bloating in the abdomen. Passage of more gas than usual.  Walking can help get rid of the air that was put into your GI tract during the procedure and reduce the bloating. If you had a lower endoscopy (such as a colonoscopy or flexible sigmoidoscopy) you may notice spotting of blood in your stool or on the toilet paper. If you underwent a bowel prep for your procedure, you may not have a normal bowel movement for a few days.  Please Note:  You might notice some irritation and congestion in your nose or some drainage.  This is from the oxygen used during your procedure.  There is no need for concern and it should clear up in a day or so.  SYMPTOMS TO REPORT IMMEDIATELY:   Following lower endoscopy (colonoscopy or flexible sigmoidoscopy):  Excessive amounts of blood in the stool  Significant tenderness or worsening of abdominal pains  Swelling of the abdomen that is new, acute  Fever of 100F or higher    For urgent or emergent issues, a gastroenterologist can  be reached at any hour by calling [phone]. Do not use MyChart messaging for urgent concerns.    DIET:  We do recommend a small meal at first, but then you may proceed to your regular diet.  Drink plenty of fluids but you should avoid alcoholic beverages for 24 hours.  ACTIVITY:  You should plan to take it easy for the rest of today and you should NOT DRIVE or use heavy machinery until tomorrow (because of the sedation medicines used during the test).    FOLLOW UP: Our staff will call the number listed on your records 48-72 hours following your procedure to check on you and address any questions or concerns that you may have regarding the information given to you following your procedure. If we do not reach you, we will leave a message.  We will attempt to reach you two times.  During this call, we will ask if you have developed any symptoms of COVID 19. If you develop any symptoms (ie: fever, flu-like symptoms, shortness of breath, cough etc.) before then, please call [phone].  If you test positive for Covid 19 in the 2 weeks post procedure, please call and report this information to [place].    If any biopsies were taken you will be contacted by phone or by letter within the next 1-3 weeks.  Please call us at [phone] if you have not heard about the biopsies  in 3 weeks.    SIGNATURES/CONFIDENTIALITY: You and/or your care partner have signed paperwork which will be entered into your electronic medical record.  These signatures attest to the fact that that the information above on your After Visit Summary has been reviewed and is understood.  Full responsibility of the confidentiality of this discharge information lies with you and/or your care-partner.

## 2020-03-31 ENCOUNTER — Telehealth: Payer: Self-pay | Admitting: *Deleted

## 2020-03-31 ENCOUNTER — Telehealth: Payer: Self-pay

## 2020-03-31 NOTE — Telephone Encounter (Signed)
Follow up call without answer. VM left.

## 2020-03-31 NOTE — Telephone Encounter (Signed)
  Follow up Call-  Call back number 03/29/2020  Post procedure Call Back phone  # [phone]  Permission to leave phone message Yes  Some recent data might be hidden     Patient questions:  Do you have a fever, pain , or abdominal swelling? No. Pain Score  0 *  Have you tolerated food without any problems? Yes.    Have you been able to return to your normal activities? Yes.    Do you have any questions about your discharge instructions: Diet   No. Medications  No. Follow up visit  No.  Do you have questions or concerns about your Care? No.  Actions: * If pain score is 4 or above: No action needed, pain <4.  1. Have you developed a fever since your procedure? no  2.   Have you had an respiratory symptoms (SOB or cough) since your procedure? no  3.   Have you tested positive for COVID 19 since your procedure no  4.   Have you had any family members/close contacts diagnosed with the COVID 19 since your procedure?  no   If yes to any of these questions please route to Joylene John, RN and Joella Prince, RN

## 2020-04-06 ENCOUNTER — Encounter: Payer: Self-pay | Admitting: Internal Medicine

## 2020-05-25 ENCOUNTER — Other Ambulatory Visit (HOSPITAL_COMMUNITY): Payer: Self-pay | Admitting: *Deleted

## 2020-05-26 ENCOUNTER — Other Ambulatory Visit: Payer: Self-pay

## 2020-05-26 ENCOUNTER — Ambulatory Visit: Payer: PRIVATE HEALTH INSURANCE | Admitting: Internal Medicine

## 2020-05-26 ENCOUNTER — Ambulatory Visit (HOSPITAL_BASED_OUTPATIENT_CLINIC_OR_DEPARTMENT_OTHER)
Admission: RE | Admit: 2020-05-26 | Discharge: 2020-05-26 | Disposition: A | Discharge status: Home / Self Care | Payer: PRIVATE HEALTH INSURANCE | Source: Ambulatory Visit | Attending: Internal Medicine | Admitting: Internal Medicine

## 2020-05-26 ENCOUNTER — Other Ambulatory Visit (INDEPENDENT_AMBULATORY_CARE_PROVIDER_SITE_OTHER): Payer: PRIVATE HEALTH INSURANCE

## 2020-05-26 ENCOUNTER — Encounter: Payer: Self-pay | Admitting: Internal Medicine

## 2020-05-26 VITALS — BP 132/90 | HR 70 | Ht 69.0 in | Wt 163.0 lb

## 2020-05-26 DIAGNOSIS — R1013 Epigastric pain: Secondary | ICD-10-CM

## 2020-05-26 DIAGNOSIS — R1011 Right upper quadrant pain: Secondary | ICD-10-CM | POA: Insufficient documentation

## 2020-05-26 DIAGNOSIS — R10811 Right upper quadrant abdominal tenderness: Secondary | ICD-10-CM

## 2020-05-26 LAB — CBC WITH DIFFERENTIAL/PLATELET
Basophils Absolute: 0.1 10*3/uL (ref 0.0–0.1)
Basophils Relative: 1.3 % (ref 0.0–3.0)
Eosinophils Absolute: 0.1 10*3/uL (ref 0.0–0.7)
Eosinophils Relative: 1.6 % (ref 0.0–5.0)
HCT: 45.6 % (ref 39.0–52.0)
Hemoglobin: 15.5 g/dL (ref 13.0–17.0)
Lymphocytes Relative: 20.7 % (ref 12.0–46.0)
Lymphs Abs: 1.4 10*3/uL (ref 0.7–4.0)
MCHC: 34.1 g/dL (ref 30.0–36.0)
MCV: 87.3 fl (ref 78.0–100.0)
Monocytes Absolute: 0.6 10*3/uL (ref 0.1–1.0)
Monocytes Relative: 8.1 % (ref 3.0–12.0)
Neutro Abs: 4.7 10*3/uL (ref 1.4–7.7)
Neutrophils Relative %: 68.3 % (ref 43.0–77.0)
Platelets: 207 10*3/uL (ref 150.0–400.0)
RBC: 5.22 Mil/uL (ref 4.22–5.81)
RDW: 13.7 % (ref 11.5–15.5)
WBC: 6.9 10*3/uL (ref 4.0–10.5)

## 2020-05-26 LAB — LIPASE: Lipase: 17 U/L (ref 11.0–59.0)

## 2020-05-26 LAB — COMPREHENSIVE METABOLIC PANEL
ALT: 11 U/L (ref 0–53)
AST: 15 U/L (ref 0–37)
Albumin: 4.2 g/dL (ref 3.5–5.2)
Alkaline Phosphatase: 73 U/L (ref 39–117)
BUN: 15 mg/dL (ref 6–23)
CO2: 31 mEq/L (ref 19–32)
Calcium: 9.7 mg/dL (ref 8.4–10.5)
Chloride: 101 mEq/L (ref 96–112)
Creatinine, Ser: 1.23 mg/dL (ref 0.40–1.50)
GFR: 62.82 mL/min (ref >60.00)
Glucose, Bld: 97 mg/dL (ref 70–99)
Potassium: 4.2 mEq/L (ref 3.5–5.1)
Sodium: 138 mEq/L (ref 135–145)
Total Bilirubin: 2.7 mg/dL — ABNORMAL HIGH (ref 0.2–1.2)
Total Protein: 7.6 g/dL (ref 6.0–8.3)

## 2020-05-26 LAB — AMYLASE: Amylase: 55 U/L (ref 27–131)

## 2020-05-26 MED ORDER — PANTOPRAZOLE SODIUM 40 MG PO TBEC: 40.0000 mg | DELAYED_RELEASE_TABLET | Freq: Every day | ORAL | 0 refills | Status: DC

## 2020-05-26 NOTE — Progress Notes (Signed)
David Mclaughlin 65 y.o. May 29, 1955 [id number]  Assessment & Plan:   Encounter Diagnoses  Name Primary?  . RUQ pain Yes  . Abdominal pain, epigastric   . Right upper quadrant abdominal tenderness without rebound tenderness    Cause not clear wondering about biliary first.  Lab work-up and ultrasound as below.  Differential includes ulcer disease.  Lab Results  Component Value Date   ALT 11 05/26/2020   AST 15 05/26/2020   ALKPHOS 73 05/26/2020   BILITOT 2.7 (H) 05/26/2020   Lab Results  Component Value Date   CREATININE 1.23 05/26/2020   BUN 15 05/26/2020   NA 138 05/26/2020   K 4.2 05/26/2020   CL 101 05/26/2020   CO2 31 05/26/2020  l Lab Results  Component Value Date   LIPASE 17.0 05/26/2020   Lab Results  Component Value Date   WBC 6.9 05/26/2020   HGB 15.5 05/26/2020   HCT 45.6 05/26/2020   MCV 87.3 05/26/2020   PLT 207.0 05/26/2020   Labs were done today as above.  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  That can explain the elevated bilirubin.  Ultrasound was done as well.   IMPRESSION: 1. No acute finding.  Surgically absent gallbladder. 2. Ultrasound evidence of a small benign hemangioma in the left hepatic lobe and a small benign cyst in the right hepatic lobe.   Even though he has not had response to pantoprazole we will proceed with an EGD I think that the neck step.  My partner Dr. Fuller Plan will do that tomorrow.  He also had a CT abdomen and pelvis at [facility] last year.  Change in bowel habit and weight loss was the indication.  Results copied below.  If EGD is negative would probably consider trying something like dicyclomine.    LOWER CHEST:  . Mediastinum: Within normal limits.  . Heart/vessel: Normal heart size. No pericardial effusion.  . Lungs: Within normal limits.  . Pleura: Within normal limits.   ABDOMEN / PELVIS:  . Liver: 9 mm hyperenhancing lesion in segment 3 of the liver is indeterminate on this study, however may represent  a flash filling hemangioma.2.3 x 2.2 cm fluid attenuating lesion in segment 6 of the liver most likely representing a benign cyst.  . Gallbladder/biliary: Cholecystectomy. No intra or extrahepatic bile duct dilation.  Marland Kitchen Spleen: Within normal limits. 1.8 cm splenule near the splenic hilum.  . Pancreas: Within normal limits.  . Adrenals: Within normal limits.  . Kidneys: Within normal limits.  . Peritoneum / Mesentery: Within normal limits.  . Extraperitoneum: Within normal limits.  . GI tract: Small hiatal hernia. Diffusewall thickening of the lower esophagus. Stomach , small bowel and large bowel loops do not show evidence of abnormal bowel wall thickening or abnormal dilatation. Appendix is visualized and appears unremarkable.  . Ureters: Within normal limits.  . Bladder: Within normal limits.  . Reproductive system: Nonspecific prostatic calcification.  . Vascular: Mild atherosclerotic calcification involving abdominal aorta without aneurysmal changes or significant stenosis. 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  MSK:  . Polyarticular degenerative changes. No acute bony findings.   Also note aorta normal caliber on previous CT cardiac scoring last year. 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  He is status postcholecystectomy.  This has been relatively constant  and first awakened him at night and he even wondered about going to the ER.  He has been taking his wife's pantoprazole 40 mg twice daily and it has not seemed to have touched it.  There is no nausea vomiting fever change in bowels.  He does have relatively chronic alternating bowel habits and a diagnosis of IBS.  I performed a screening colonoscopy in March of this year, showing 2 diminutive polyps that are  adenomas, and some internal hemorrhoids.  He has a healthy diet overall rare alcohol.  No significant NSAIDs.  He does not really recall if this is similar to his gallbladder pain but he does wonder about having a stone.  I do not think he is ever had an EGD. No Known Allergies Current Meds  Medication Sig  . Coenzyme Q10 (CO Q 10) 100 MG CAPS Take 1 capsule by mouth daily.  . fluticasone (FLONASE) 50 MCG/ACT nasal spray Place 1 spray into both nostrils daily.  Marland Kitchen ibuprofen (ADVIL,MOTRIN) 200 MG tablet Take 600 mg by mouth every 6 (six) hours as needed for pain.  Marland Kitchen levothyroxine (SYNTHROID) 88 MCG tablet Take 1 tablet by mouth daily.  Marland Kitchen loratadine (CLARITIN) 10 MG tablet Take 10 mg by mouth daily.  . pantoprazole (PROTONIX) 40 MG tablet Take 1 tablet (40 mg total) by mouth daily before breakfast.  . pseudoephedrine (SUDAFED) 30 MG tablet Take 30 mg by mouth every 4 (four) hours as needed for congestion.   Past Medical History:  Diagnosis Date  . BPPV (benign paroxysmal positional vertigo)   . Cavernous angioma 1996  . Dizziness   . Gallstones 2016  . Hx of adenomatous polyp of colon 03/2020  . Hypercholesteremia   . Hypothyroidism   . IBS (irritable bowel syndrome)   . Palpitations   . PVC's (premature ventricular contractions)    positional  . Shulman's syndrome    Past Surgical History:  Procedure Laterality Date  . [place]  . CHOLECYSTECTOMY    . COLONOSCOPY     Social History   Social History Narrative   Married 3 Network engineer for [employer]   Occasional alcohol, never smoker, no drug use   family history includes Colon polyps in his sister; Heart attack in an other family member; Heart disease in an other family member; Hypertension in an other family member.   Review of Systems As per HPI  Objective:   Physical Exam BP 132/90   Pulse 70   Ht 5\' 9"  (1.753 m)   Wt 163 lb (73.9 kg)   BMI 24.07 kg/m  NAD Lungs  cta +s4 abd mild RUQ tederness Rectal brown and heme neg

## 2020-05-26 NOTE — Progress Notes (Signed)
amb ref gastro  

## 2020-05-26 NOTE — Patient Instructions (Addendum)
Your provider has requested that you go to the basement level for lab work before leaving today. Press "B" on the elevator. The lab is located at the first door on the left as you exit the elevator.  We have sent the following medications to your pharmacy for you to pick up at your convenience: Pantoprazole  You have been scheduled for an abdominal ultrasound at [place] location  today at 11:00am. Please arrive 15 minutes prior to your appointment for registration. Make certain not to have anything to eat or drink 6 hours prior to your appointment. Should you need to reschedule your appointment, please contact radiology at [phone]. This test typically takes about 30 minutes to perform.   I appreciate the opportunity to care for you. Silvano Rusk, MD, [facility]

## 2020-05-27 ENCOUNTER — Ambulatory Visit (AMBULATORY_SURGERY_CENTER): Payer: PRIVATE HEALTH INSURANCE | Admitting: Gastroenterology

## 2020-05-27 ENCOUNTER — Encounter: Payer: Self-pay | Admitting: Gastroenterology

## 2020-05-27 VITALS — BP 111/68 | HR 56 | Temp 96.9°F | Resp 14 | Ht 69.0 in | Wt 163.0 lb

## 2020-05-27 DIAGNOSIS — K319 Disease of stomach and duodenum, unspecified: Secondary | ICD-10-CM

## 2020-05-27 DIAGNOSIS — K449 Diaphragmatic hernia without obstruction or gangrene: Secondary | ICD-10-CM

## 2020-05-27 DIAGNOSIS — R1011 Right upper quadrant pain: Secondary | ICD-10-CM

## 2020-05-27 DIAGNOSIS — R1013 Epigastric pain: Secondary | ICD-10-CM

## 2020-05-27 MED ORDER — SODIUM CHLORIDE 0.9 % IV SOLN: 500.0000 mL | Freq: Once | INTRAVENOUS | Status: DC

## 2020-05-27 NOTE — Progress Notes (Signed)
Report given to PACU, vss 

## 2020-05-27 NOTE — Progress Notes (Signed)
1022 Robinul 0.1 mg IV given due large amount of secretions upon assessment.  MD made aware, vss 

## 2020-05-27 NOTE — Patient Instructions (Signed)
YOU HAD AN ENDOSCOPIC PROCEDURE TODAY AT THE [facility]:   Refer to the procedure report that was given to you for any specific questions about what was found during the examination.  If the procedure report does not answer your questions, please call your gastroenterologist to clarify.  If you requested that your care partner not be given the details of your procedure findings, then the procedure report has been included in a sealed envelope for you to review at your convenience later.  YOU SHOULD EXPECT: Some feelings of bloating in the abdomen. Passage of more gas than usual.  Walking can help get rid of the air that was put into your GI tract during the procedure and reduce the bloating. If you had a lower endoscopy (such as a colonoscopy or flexible sigmoidoscopy) you may notice spotting of blood in your stool or on the toilet paper. If you underwent a bowel prep for your procedure, you may not have a normal bowel movement for a few days.  Please Note:  You might notice some irritation and congestion in your nose or some drainage.  This is from the oxygen used during your procedure.  There is no need for concern and it should clear up in a day or so.  SYMPTOMS TO REPORT IMMEDIATELY:    Following upper endoscopy (EGD)  Vomiting of blood or coffee ground material  New chest pain or pain under the shoulder blades  Painful or persistently difficult swallowing  New shortness of breath  Fever of 100F or higher  Black, tarry-looking stools  For urgent or emergent issues, a gastroenterologist can be reached at any hour by calling [phone]. Do not use MyChart messaging for urgent concerns.    DIET:  We do recommend a small meal at first, but then you may proceed to your regular diet.  Drink plenty of fluids but you should avoid alcoholic beverages for 24 hours.  ACTIVITY:  You should plan to take it easy for the rest of today and you should NOT DRIVE or use heavy machinery  until tomorrow (because of the sedation medicines used during the test).    FOLLOW UP: Our staff will call the number listed on your records 48-72 hours following your procedure to check on you and address any questions or concerns that you may have regarding the information given to you following your procedure. If we do not reach you, we will leave a message.  We will attempt to reach you two times.  During this call, we will ask if you have developed any symptoms of COVID 19. If you develop any symptoms (ie: fever, flu-like symptoms, shortness of breath, cough etc.) before then, please call [phone].  If you test positive for Covid 19 in the 2 weeks post procedure, please call and report this information to us.    If any biopsies were taken you will be contacted by phone or by letter within the next 1-3 weeks.  Please call us at [phone] if you have not heard about the biopsies in 3 weeks.    SIGNATURES/CONFIDENTIALITY: You and/or your care partner have signed paperwork which will be entered into your electronic medical record.  These signatures attest to the fact that that the information above on your After Visit Summary has been reviewed and is understood.  Full responsibility of the confidentiality of this discharge information lies with you and/or your care-partner. 

## 2020-05-27 NOTE — Progress Notes (Signed)
Pt slow to wake from anesthesia.  Wife in room at bedside.  Wife to remain in room while pt dresses.

## 2020-05-27 NOTE — Progress Notes (Signed)
Medical history reviewed with no changes noted. VS assessed by J.D 

## 2020-05-27 NOTE — Op Note (Signed)
[facility] Patient Name: David Mclaughlin Procedure Date: 05/27/2020 10:12 AM MRN: [id number] Endoscopist: Ladene Artist , MD Age: 65 Referring MD:  Date of Birth: [birth year] Gender: Male Account #: [id number] Procedure:                Upper GI endoscopy Indications:              Epigastric abdominal pain, Abdominal pain in the                            right upper quadrant Medicines:                Monitored Anesthesia Care Procedure:                Pre-Anesthesia Assessment:                           - Prior to the procedure, a History and Physical                            was performed, and patient medications and                            allergies were reviewed. The patient's tolerance of                            previous anesthesia was also reviewed. The risks                            and benefits of the procedure and the sedation                            options and risks were discussed with the patient.                            All questions were answered, and informed consent                            was obtained. Prior Anticoagulants: The patient has                            taken no previous anticoagulant or antiplatelet                            agents. ASA Grade Assessment: II - A patient with                            mild systemic disease. After reviewing the risks                            and benefits, the patient was deemed in                            satisfactory condition to undergo the procedure.  After obtaining informed consent, the endoscope was                            passed under direct vision. Throughout the                            procedure, the patient's blood pressure, pulse, and                            oxygen saturations were monitored continuously. The                            Endoscope was introduced through the mouth, and                            advanced to the third part of  duodenum. The upper                            GI endoscopy was accomplished without difficulty.                            The patient tolerated the procedure well. Scope In: Scope Out: Findings:                 The examined esophagus was normal.                           Diffuse mildly erythematous mucosa without bleeding                            was found in the gastric body and in the gastric                            antrum. Biopsies were taken with a cold forceps for                            histology. R/O gastritis.                           A small hiatal hernia was present.                           The exam of the stomach was otherwise normal.                           The duodenal bulb, second portion of the duodenum                            and third portion of the duodenum were normal. Complications:            No immediate complications. Estimated Blood Loss:     Estimated blood loss was minimal. Impression:               - Normal esophagus.                           -  Erythematous mucosa in the gastric body and                            antrum. Biopsied.                           - Small hiatal hernia.                           - Normal duodenal bulb, second portion of the                            duodenum and third portion of the duodenum. Recommendation:           - Patient has a contact number available for                            emergencies. The signs and symptoms of potential                            delayed complications were discussed with the                            patient. Return to normal activities tomorrow.                            Written discharge instructions were provided to the                            patient.                           - Resume previous diet.                           - Continue present medications.                           - Dicyclomine 10 mg po ac and hs, 1 year of refills.                           - Await  pathology results. Ladene Artist, MD 05/27/2020 10:44:36 AM This report has been signed electronically.

## 2020-05-27 NOTE — Progress Notes (Signed)
Called to room to assist during endoscopic procedure.  Patient ID and intended procedure confirmed with present staff. Received instructions for my participation in the procedure from the performing physician.  

## 2020-05-31 ENCOUNTER — Ambulatory Visit (HOSPITAL_COMMUNITY)
Admission: RE | Admit: 2020-05-31 | Discharge: 2020-05-31 | Disposition: A | Discharge status: Home / Self Care | Payer: PRIVATE HEALTH INSURANCE | Source: Ambulatory Visit | Attending: Cardiology | Admitting: Cardiology

## 2020-05-31 ENCOUNTER — Telehealth: Payer: Self-pay

## 2020-05-31 NOTE — Telephone Encounter (Signed)
  Follow up Call-  Call back number 05/27/2020 03/29/2020  Post procedure Call Back phone  # [phone]  Permission to leave phone message Yes Yes  Some recent data might be hidden     Patient questions:  Do you have a fever, pain , or abdominal swelling? No. Pain Score  0 *  Have you tolerated food without any problems? Yes.    Have you been able to return to your normal activities? Yes.    Do you have any questions about your discharge instructions: Diet   No. Medications  No. Follow up visit  No.  Do you have questions or concerns about your Care? Yes.      Pt reported his lower right side of his lip is numb since the EGD.  Numbness begins in the center of lower lip and goes back to corner of his lip.  He states he is not worried about it.  He is eating normally.  I advised his I would send this message to Dr. Fuller Plan and Anesthesia.  Actions: * If pain score is 4 or above: No action needed, pain <4.   1. Have you developed a fever since your procedure? no  2.   Have you had an respiratory symptoms (SOB or cough) since your procedure? no  3.   Have you tested positive for COVID 19 since your procedure no  4.   Have you had any family members/close contacts diagnosed with the COVID 19 since your procedure?  no   If yes to any of these questions please route to Joylene John, RN and Joella Prince, RN

## 2020-05-31 NOTE — Telephone Encounter (Signed)
Phone call back to pt and left him a message on answering machine.  Per Dr. Fuller Plan and Osvaldo Angst, CRNA that possibly a nerve was impinged.  It may take a little time for the numbness to go away.  Pt was told to call us back is do not improve or that they worsen.  Maw/rn

## 2020-05-31 NOTE — Telephone Encounter (Signed)
Per Dr. Fuller Plan, call pt back and see if his lower lip is still numb.  Call to pt and he still reports numbness from center of lower lip to the right corner of his mouth.  Pt said that he was not concerned about it.  But wanted [place] to be aware of his symptom and thinks it is from the "plastic he bit on" during the procedure. maw

## 2020-06-04 ENCOUNTER — Encounter: Payer: Self-pay | Admitting: Gastroenterology

## 2020-06-09 DIAGNOSIS — U071 COVID-19: Secondary | ICD-10-CM

## 2020-06-09 HISTORY — DX: COVID-19: U07.1

## 2020-06-11 ENCOUNTER — Telehealth: Payer: Self-pay | Admitting: Internal Medicine

## 2020-06-11 MED ORDER — DICYCLOMINE HCL 20 MG PO TABS: 20.0000 mg | ORAL_TABLET | Freq: Four times a day (QID) | ORAL | 2 refills | Status: DC | PRN

## 2020-06-11 NOTE — Telephone Encounter (Signed)
Bentyl sent in as requested and patient notified.

## 2020-07-06 ENCOUNTER — Other Ambulatory Visit (HOSPITAL_COMMUNITY): Payer: Self-pay

## 2020-07-06 MED ORDER — DOXYCYCLINE HYCLATE 100 MG PO CAPS
100.0000 mg | ORAL_CAPSULE | Freq: Two times a day (BID) | ORAL | 0 refills | Status: DC
  Filled 2020-07-06: qty 20, 10d supply, fill #0

## 2020-07-06 MED ORDER — METHYLPREDNISOLONE 4 MG PO TBPK
ORAL_TABLET | ORAL | 0 refills | Status: DC
  Filled 2020-07-06: qty 21, 6d supply, fill #0

## 2020-07-06 MED ORDER — ALBUTEROL SULFATE HFA 108 (90 BASE) MCG/ACT IN AERS
2.0000 | INHALATION_SPRAY | RESPIRATORY_TRACT | 0 refills | Status: DC | PRN
  Filled 2020-07-06: qty 8.5, 16d supply, fill #0

## 2020-07-06 MED ORDER — GUAIFENESIN-CODEINE 100-10 MG/5ML PO SOLN
5.0000 mL | ORAL | 0 refills | Status: DC | PRN
  Filled 2020-07-06: qty 118, 2d supply, fill #0

## 2020-07-09 DIAGNOSIS — J189 Pneumonia, unspecified organism: Secondary | ICD-10-CM

## 2020-07-09 HISTORY — DX: Pneumonia, unspecified organism: J18.9

## 2020-10-06 DIAGNOSIS — R0981 Nasal congestion: Secondary | ICD-10-CM | POA: Insufficient documentation

## 2020-10-11 ENCOUNTER — Other Ambulatory Visit: Payer: Self-pay | Admitting: Internal Medicine

## 2020-12-15 ENCOUNTER — Other Ambulatory Visit (HOSPITAL_COMMUNITY): Payer: Self-pay

## 2020-12-15 MED ORDER — PREDNISONE 20 MG PO TABS
ORAL_TABLET | ORAL | 0 refills | Status: AC
  Filled 2020-12-15: qty 9, 6d supply, fill #0

## 2020-12-23 ENCOUNTER — Other Ambulatory Visit (HOSPITAL_COMMUNITY): Payer: Self-pay

## 2020-12-23 MED ORDER — ROSUVASTATIN CALCIUM 5 MG PO TABS
5.0000 mg | ORAL_TABLET | Freq: Every day | ORAL | 1 refills | Status: DC
  Filled 2020-12-23: qty 90, 90d supply, fill #0
  Filled 2021-03-14: qty 30, 30d supply, fill #1

## 2021-02-08 ENCOUNTER — Other Ambulatory Visit: Payer: Self-pay | Admitting: Internal Medicine

## 2021-02-26 ENCOUNTER — Other Ambulatory Visit (HOSPITAL_COMMUNITY): Payer: Self-pay

## 2021-02-26 MED ORDER — METHYLPREDNISOLONE 4 MG PO TBPK
ORAL_TABLET | ORAL | 0 refills | Status: DC
  Filled 2021-02-26: qty 21, 6d supply, fill #0

## 2021-02-26 MED ORDER — DOXYCYCLINE MONOHYDRATE 100 MG PO CAPS
ORAL_CAPSULE | ORAL | 0 refills | Status: DC
  Filled 2021-02-26: qty 20, 10d supply, fill #0

## 2021-02-26 MED ORDER — DEXTROMETHORPHAN-GUAIFENESIN 10-100 MG/5ML PO SYRP
ORAL_SOLUTION | ORAL | 0 refills | Status: DC
  Filled 2021-02-26: qty 118, 10d supply, fill #0

## 2021-03-04 ENCOUNTER — Other Ambulatory Visit: Payer: PRIVATE HEALTH INSURANCE

## 2021-03-04 ENCOUNTER — Encounter: Payer: Self-pay | Admitting: Internal Medicine

## 2021-03-04 ENCOUNTER — Other Ambulatory Visit (HOSPITAL_COMMUNITY): Payer: Self-pay

## 2021-03-04 ENCOUNTER — Ambulatory Visit (INDEPENDENT_AMBULATORY_CARE_PROVIDER_SITE_OTHER): Payer: PRIVATE HEALTH INSURANCE | Admitting: Internal Medicine

## 2021-03-04 ENCOUNTER — Other Ambulatory Visit: Payer: Self-pay

## 2021-03-04 VITALS — BP 120/70 | HR 86 | Ht 69.0 in | Wt 168.0 lb

## 2021-03-04 DIAGNOSIS — R053 Chronic cough: Secondary | ICD-10-CM

## 2021-03-04 DIAGNOSIS — K219 Gastro-esophageal reflux disease without esophagitis: Secondary | ICD-10-CM

## 2021-03-04 DIAGNOSIS — R195 Other fecal abnormalities: Secondary | ICD-10-CM

## 2021-03-04 MED ORDER — HYDROCODONE BIT-HOMATROP MBR 5-1.5 MG/5ML PO SOLN
ORAL | 0 refills | Status: DC
  Filled 2021-03-04: qty 120, fill #0

## 2021-03-04 MED ORDER — FLUTICASONE PROPIONATE HFA 110 MCG/ACT IN AERO
1.0000 | INHALATION_SPRAY | Freq: Two times a day (BID) | RESPIRATORY_TRACT | 1 refills | Status: DC
  Filled 2021-03-04: qty 12, 30d supply, fill #0

## 2021-03-04 MED ORDER — HYDROCODONE BIT-HOMATROP MBR 5-1.5 MG/5ML PO SOLN
5.0000 mL | Freq: Every evening | ORAL | 0 refills | Status: DC
  Filled 2021-03-04: qty 120, 24d supply, fill #0

## 2021-03-04 MED ORDER — ESOMEPRAZOLE MAGNESIUM 40 MG PO CPDR
40.0000 mg | DELAYED_RELEASE_CAPSULE | Freq: Two times a day (BID) | ORAL | 1 refills | Status: DC
  Filled 2021-03-04: qty 60, 30d supply, fill #0

## 2021-03-04 NOTE — Patient Instructions (Addendum)
If you are age 64 or older, your body mass index should be between 23-30. Your Body mass index is 24.81 kg/m. If this is out of the aforementioned range listed, please consider follow up with your Primary Care Provider.  If you are age 50 or younger, your body mass index should be between 19-25. Your Body mass index is 24.81 kg/m. If this is out of the aformentioned range listed, please consider follow up with your Primary Care Provider.   ________________________________________________________  The  GI providers would like to encourage you to use [facility] to communicate with providers for non-urgent requests or questions.  Due to long hold times on the telephone, sending your provider a message by [facility] may be a faster and more efficient way to get a response.  Please allow 48 business hours for a response.  Please remember that this is for non-urgent requests.  _____________________________________________________ Start Nexium 40 mg twice a day.   Use Pepcid over the counter, 40 mg at bedtime.   Follow up in 6 weeks   It was a pleasure to see you today!  Thank you for trusting me with your gastrointestinal care!

## 2021-03-04 NOTE — Progress Notes (Signed)
David Mclaughlin 66 y.o. [birth year] [id number]  Assessment & Plan:   Encounter Diagnoses  Name Primary?   Chronic cough Yes   Gastroesophageal reflux disease, unspecified whether esophagitis present - suspected    Loose stools     I do suspect he has some GERD but I do not think it is necessarily a primary problem here.  Things have been off ever since he had COVID and then pneumonia and now he is being treated with bronchitis again.  I suspect there is a GERD component going on here based upon his symptoms but he seems to have some sort of chronic cyclical cough.  I am going to treat with maximal acid suppression as well as a steroid inhaler and a brief spell of nocturnal narcotic cough suppression.  He will return in about 6 weeks for reassessment.  He may need to go to pulmonary.  He does not use caffeine but I did review lifestyle measures for GERD and he understands to follow these as well.  Evaluate chronic loose stools with celiac screening.  This sounds most like irritable bowel syndrome.  He had a screening colonoscopy in March of last year with no evidence of colitis though at that time it was for screening and no random biopsies were done.  However this does not sound like a chronic microscopic colitis issue either.   Meds ordered this encounter  Medications   esomeprazole (NEXIUM) 40 MG capsule    Sig: Take 1 capsule (40 mg total) by mouth 2 (two) times daily before a meal.    Dispense:  180 capsule    Refill:  1   fluticasone (FLOVENT HFA) 110 MCG/ACT inhaler    Sig: Inhale 1 puff into the lungs in the morning and at bedtime.    Dispense:  12 g    Refill:  1   DISCONTD: HYDROcodone bit-homatropine (HYCODAN) 5-1.5 MG/5ML syrup    Sig: Every evening x 1 week then as needed    Dispense:  120 mL    Refill:  0   HYDROcodone bit-homatropine (HYCODAN) 5-1.5 MG/5ML syrup    Sig: Take 5 mLs by mouth every evening for 1 week, then as needed    Dispense:  120 mL    Refill:   0    Subjective:   Chief Complaint: Cough and reflux  HPI 66 year old married white man who is here with a couple of complaints, 1 centers around the cough and heartburn issues.  He was seen last in May 2022 with intense right upper quadrant and some epigastric pain, he had an EGD and an ultrasound that were unrevealing.  Then in the summer he got COVID.  Several weeks later he had problems with pneumonia and was treated with antibiotics and steroids.  More recently he has had cough issues and he has been treated with antibiotics and steroid taper again.  He has a lot of intermittent heartburn type issues with burning and reflux sensation but no dysphagia.  He has a chronic daily cough that is bothersome and noticed by others.  It is dry and nonproductive.  He has not taken anything specifically for the cough.  He has not been on any steroid inhalers.  Its not clear to me that when he has taken oral prednisone that it is helped either.  He is concerned that this could be reflux related.  He switched to taking his pantoprazole at bedtime and thinks that might be helping a little bit more with  the nocturnal component of heartburn symptoms.  He is also using antacids and H2 blockers at times.  Pepcid 40 mg.  Negative chest x-ray February 26, 2021  Results of EGD 05/28/2020 - Normal esophagus. - Erythematous mucosa in the gastric body and antrum. Biopsied. - Small hiatal hernia. - Normal duodenal bulb, second portion of the duodenum and third portion of the duodenum. Surgical [P], gastric antrum and gastric body - GASTRIC ANTRAL MUCOSA WITH MILD NONSPECIFIC REACTIVE GASTROPATHY - GASTRIC OXYNTIC MUCOSA WITH NO SPECIFIC HISTOPATHOLOGIC CHANGES - WARTHIN STARRY STAIN IS NEGATIVE FOR HELICOBACTER PYLORI    No Known Allergies Current Meds  Medication Sig   Coenzyme Q10 (CO Q 10) 100 MG CAPS Take 1 capsule by mouth daily.       fluticasone (FLONASE) 50 MCG/ACT nasal spray Place 1 spray into both  nostrils daily.       levothyroxine (SYNTHROID) 100 MCG tablet 100 mcg.   loratadine (CLARITIN) 10 MG tablet Take 10 mg by mouth daily.   pseudoephedrine (SUDAFED) 30 MG tablet Take 30 mg by mouth every 4 (four) hours as needed for congestion.   rosuvastatin (CRESTOR) 5 MG tablet Take 1 tablet (5 mg total) by mouth daily.        pantoprazole (PROTONIX) 40 MG tablet Take 1 tablet (40 mg total) by mouth daily before breakfast.   Past Medical History:  Diagnosis Date   BPPV (benign paroxysmal positional vertigo)    Cavernous angioma 1996   Dizziness    Gallstones 2016   Gilbert syndrome    Hx of adenomatous polyp of colon 03/2020   Hypercholesteremia    Hypothyroidism    IBS (irritable bowel syndrome)    Palpitations    PVC's (premature ventricular contractions)    positional   Shulman's syndrome    eosinophilic fasciitis   Past Surgical History:  Procedure Laterality Date   [place] History Narrative   Married 3 Network engineer for [employer]   Occasional alcohol, never smoker, no drug use   family history includes Colon polyps in his sister; Heart attack in an other family member; Heart disease in an other family member; Hypertension in an other family member.   Review of Systems As above  Objective:   Physical Exam BP 120/70    Pulse 86    Ht 5\' 9"  (1.753 m)    Wt 168 lb (76.2 kg)    SpO2 95%    BMI 24.81 kg/m  The patient is coughing during the interview and exam\ He is alert and oriented x3 and in no acute distress Normal voice quality Pharynx shows some mild posterior erythema but no thrush or other abnormalities Lungs are clear without wheezing and good air movement Heart sounds normal S1-S2 no rubs murmurs or gallops The abdomen is thin soft and nontender

## 2021-03-07 LAB — TISSUE TRANSGLUTAMINASE, IGA: (tTG) Ab, IgA: <1 U/mL

## 2021-03-07 LAB — IGA: Immunoglobulin A: 339 mg/dL — ABNORMAL HIGH (ref 70–320)

## 2021-03-14 ENCOUNTER — Other Ambulatory Visit (HOSPITAL_COMMUNITY): Payer: Self-pay

## 2021-04-05 ENCOUNTER — Other Ambulatory Visit (HOSPITAL_COMMUNITY): Payer: Self-pay

## 2021-04-12 LAB — HEPATIC FUNCTION PANEL
ALT: 36 U/L (ref 10–40)
AST: 30 (ref 14–40)
Alkaline Phosphatase: 88 (ref 25–125)
Bilirubin, Total: 1.6

## 2021-04-12 LAB — LIPID PANEL
Cholesterol: 163 (ref 0–200)
HDL: 54 (ref 35–70)
LDL Cholesterol: 93
Triglycerides: 86 (ref 40–160)

## 2021-04-12 LAB — BASIC METABOLIC PANEL
BUN: 18 (ref 4–21)
CO2: 26 — AB (ref 13–22)
Chloride: 101 (ref 99–108)
Creatinine: 1.4 — AB (ref 0.6–1.3)
Glucose: 97
Potassium: 4.4 mEq/L (ref 3.5–5.1)
Sodium: 140 (ref 137–147)

## 2021-04-12 LAB — TSH: TSH: 5.71 (ref 0.41–5.90)

## 2021-04-12 LAB — CBC AND DIFFERENTIAL
HCT: 46 (ref 41–53)
Hemoglobin: 15.3 (ref 13.5–17.5)
Platelets: 222 10*3/uL (ref 150–400)
WBC: 5.6

## 2021-04-12 LAB — HEMOGLOBIN A1C: Hemoglobin A1C: 5.8

## 2021-04-12 LAB — COMPREHENSIVE METABOLIC PANEL
Albumin: 4.3 (ref 3.5–5.0)
Calcium: 9.6 (ref 8.7–10.7)
eGFR: 58

## 2021-04-12 LAB — PSA: PSA: 1.8

## 2021-04-19 ENCOUNTER — Ambulatory Visit: Payer: PRIVATE HEALTH INSURANCE | Admitting: Internal Medicine

## 2021-05-26 ENCOUNTER — Other Ambulatory Visit (HOSPITAL_COMMUNITY): Payer: Self-pay

## 2021-05-26 MED ORDER — DOXYCYCLINE HYCLATE 100 MG PO CAPS
100.0000 mg | ORAL_CAPSULE | Freq: Two times a day (BID) | ORAL | 2 refills | Status: DC
  Filled 2021-05-26: qty 24, 12d supply, fill #0

## 2021-05-27 ENCOUNTER — Other Ambulatory Visit (HOSPITAL_COMMUNITY): Payer: Self-pay

## 2021-08-01 ENCOUNTER — Ambulatory Visit: Payer: PRIVATE HEALTH INSURANCE | Admitting: Internal Medicine

## 2021-08-01 ENCOUNTER — Encounter: Payer: Self-pay | Admitting: Internal Medicine

## 2021-08-01 VITALS — BP 120/76 | HR 68 | Temp 97.6°F | Ht 69.0 in | Wt 168.0 lb

## 2021-08-01 DIAGNOSIS — E039 Hypothyroidism, unspecified: Secondary | ICD-10-CM | POA: Diagnosis not present

## 2021-08-01 DIAGNOSIS — Z23 Encounter for immunization: Secondary | ICD-10-CM | POA: Diagnosis not present

## 2021-08-01 DIAGNOSIS — Z0000 Encounter for general adult medical examination without abnormal findings: Secondary | ICD-10-CM | POA: Diagnosis not present

## 2021-08-01 DIAGNOSIS — N1831 Chronic kidney disease, stage 3a: Secondary | ICD-10-CM

## 2021-08-01 DIAGNOSIS — Z0001 Encounter for general adult medical examination with abnormal findings: Secondary | ICD-10-CM | POA: Insufficient documentation

## 2021-08-01 DIAGNOSIS — E785 Hyperlipidemia, unspecified: Secondary | ICD-10-CM

## 2021-08-01 LAB — URINALYSIS, ROUTINE W REFLEX MICROSCOPIC
Bilirubin Urine: NEGATIVE
Ketones, ur: NEGATIVE
Leukocytes,Ua: NEGATIVE
Nitrite: NEGATIVE
RBC / HPF: NONE SEEN (ref 0–?)
Specific Gravity, Urine: 1.02 (ref 1.000–1.030)
Total Protein, Urine: NEGATIVE
Urine Glucose: NEGATIVE
Urobilinogen, UA: 0.2 (ref 0.0–1.0)
pH: 6.5 (ref 5.0–8.0)

## 2021-08-01 LAB — BASIC METABOLIC PANEL
BUN: 18 mg/dL (ref 6–23)
CO2: 30 mEq/L (ref 19–32)
Calcium: 9.5 mg/dL (ref 8.4–10.5)
Chloride: 104 mEq/L (ref 96–112)
Creatinine, Ser: 1.24 mg/dL (ref 0.40–1.50)
GFR: 61.7 mL/min (ref >60.00)
Glucose, Bld: 96 mg/dL (ref 70–99)
Potassium: 3.6 mEq/L (ref 3.5–5.1)
Sodium: 142 mEq/L (ref 135–145)

## 2021-08-01 LAB — TSH: TSH: 2.83 u[IU]/mL (ref 0.35–5.50)

## 2021-08-01 NOTE — Patient Instructions (Signed)
Health Maintenance, Male Adopting a healthy lifestyle and getting preventive care are important in promoting health and wellness. Ask your health care provider about: The right schedule for you to have regular tests and exams. Things you can do on your own to prevent diseases and keep yourself healthy. What should I know about diet, weight, and exercise? Eat a healthy diet  Eat a diet that includes plenty of vegetables, fruits, low-fat dairy products, and lean protein. Do not eat a lot of foods that are high in solid fats, added sugars, or sodium. Maintain a healthy weight Body mass index (BMI) is a measurement that can be used to identify possible weight problems. It estimates body fat based on height and weight. Your health care provider can help determine your BMI and help you achieve or maintain a healthy weight. Get regular exercise Get regular exercise. This is one of the most important things you can do for your health. Most adults should: Exercise for at least 150 minutes each week. The exercise should increase your heart rate and make you sweat (moderate-intensity exercise). Do strengthening exercises at least twice a week. This is in addition to the moderate-intensity exercise. Spend less time sitting. Even light physical activity can be beneficial. Watch cholesterol and blood lipids Have your blood tested for lipids and cholesterol at 64 years of age, then have this test every 5 years. You may need to have your cholesterol levels checked more often if: Your lipid or cholesterol levels are high. You are older than 64 years of age. You are at high risk for heart disease. What should I know about cancer screening? Many types of cancers can be detected early and may often be prevented. Depending on your health history and family history, you may need to have cancer screening at various ages. This may include screening for: Colorectal cancer. Prostate cancer. Skin cancer. Lung  cancer. What should I know about heart disease, diabetes, and high blood pressure? Blood pressure and heart disease High blood pressure causes heart disease and increases the risk of stroke. This is more likely to develop in people who have high blood pressure readings or are overweight. Talk with your health care provider about your target blood pressure readings. Have your blood pressure checked: Every 3-5 years if you are 18-39 years of age. Every year if you are 40 years old or older. If you are between the ages of 65 and 75 and are a current or former smoker, ask your health care provider if you should have a one-time screening for abdominal aortic aneurysm (AAA). Diabetes Have regular diabetes screenings. This checks your fasting blood sugar level. Have the screening done: Once every three years after age 45 if you are at a normal weight and have a low risk for diabetes. More often and at a younger age if you are overweight or have a high risk for diabetes. What should I know about preventing infection? Hepatitis B If you have a higher risk for hepatitis B, you should be screened for this virus. Talk with your health care provider to find out if you are at risk for hepatitis B infection. Hepatitis C Blood testing is recommended for: Everyone born from 1945 through 1965. Anyone with known risk factors for hepatitis C. Sexually transmitted infections (STIs) You should be screened each year for STIs, including gonorrhea and chlamydia, if: You are sexually active and are younger than 64 years of age. You are older than 64 years of age and your   health care provider tells you that you are at risk for this type of infection. Your sexual activity has changed since you were last screened, and you are at increased risk for chlamydia or gonorrhea. Ask your health care provider if you are at risk. Ask your health care provider about whether you are at high risk for HIV. Your health care provider  may recommend a prescription medicine to help prevent HIV infection. If you choose to take medicine to prevent HIV, you should first get tested for HIV. You should then be tested every 3 months for as long as you are taking the medicine. Follow these instructions at home: Alcohol use Do not drink alcohol if your health care provider tells you not to drink. If you drink alcohol: Limit how much you have to 0-2 drinks a day. Know how much alcohol is in your drink. In the [place] one drink equals one 12 oz bottle of beer (355 mL), one 5 oz glass of wine (148 mL), or one 1 oz glass of hard liquor (44 mL). Lifestyle Do not use any products that contain nicotine or tobacco. These products include cigarettes, chewing tobacco, and vaping devices, such as e-cigarettes. If you need help quitting, ask your health care provider. Do not use street drugs. Do not share needles. Ask your health care provider for help if you need support or information about quitting drugs. General instructions Schedule regular health, dental, and eye exams. Stay current with your vaccines. Tell your health care provider if: You often feel depressed. You have ever been abused or do not feel safe at home. Summary Adopting a healthy lifestyle and getting preventive care are important in promoting health and wellness. Follow your health care provider's instructions about healthy diet, exercising, and getting tested or screened for diseases. Follow your health care provider's instructions on monitoring your cholesterol and blood pressure. This information is not intended to replace advice given to you by your health care provider. Make sure you discuss any questions you have with your health care provider. Document Revised: 05/17/2020 Document Reviewed: 05/17/2020 Elsevier Patient Education  2023 [employer]  

## 2021-08-01 NOTE — Progress Notes (Unsigned)
Subjective:  Patient ID: David Mclaughlin, male    DOB: 07-19-1955  Age: 66 y.o. MRN: [id number]  CC: Annual Exam, Hyperlipidemia, and Hypothyroidism   HPI David Mclaughlin presents for a CPX and f/up -   History David Mclaughlin has a past medical history of BPPV (benign paroxysmal positional vertigo), Cavernous angioma (1996), COVID-19 (06/2020), Dizziness, Gallstones (2016), GERD (gastroesophageal reflux disease), Gilbert syndrome, adenomatous polyp of colon (03/2020), Hypercholesteremia, Hypothyroidism, IBS (irritable bowel syndrome), Palpitations, Pneumonia (07/2020), PVC's (premature ventricular contractions), and Shulman's syndrome.   He has a past surgical history that includes Back surgery ([place]); Cholecystectomy; and Colonoscopy.   His family history includes Bladder Cancer in his brother; Colon polyps in his sister; Heart attack in an other family member; Heart disease in an other family member; Hypertension in his mother and another family member; Parkinson's disease in his father.He reports that he has never smoked. He has never used smokeless tobacco. He reports that he does not drink alcohol and does not use drugs.  Outpatient Medications Prior to Visit  Medication Sig Dispense Refill   Coenzyme Q10 (CO Q 10) 100 MG CAPS Take 1 capsule by mouth daily.     esomeprazole (NEXIUM) 40 MG capsule Take 1 capsule (40 mg total) by mouth 2 (two) times daily before a meal. 180 capsule 1   fluticasone (FLONASE) 50 MCG/ACT nasal spray Place 1 spray into both nostrils daily.     levothyroxine (SYNTHROID) 100 MCG tablet 100 mcg.     loratadine (CLARITIN) 10 MG tablet Take 10 mg by mouth daily.     rosuvastatin (CRESTOR) 5 MG tablet Take 1 tablet (5 mg total) by mouth daily. 90 tablet 1   ibuprofen (ADVIL,MOTRIN) 200 MG tablet Take 600 mg by mouth every 6 (six) hours as needed for pain.     doxycycline (VIBRAMYCIN) 100 MG capsule Take 1 capsule (100 mg total) by mouth 2 (two) times daily for  12 days. Take with food & water (sun warning). (Patient not taking: Reported on 08/01/2021) 24 capsule 2   fluticasone (FLOVENT HFA) 110 MCG/ACT inhaler Inhale 1 puff into the lungs in the morning and at bedtime. (Patient not taking: Reported on 08/01/2021) 12 g 1   HYDROcodone bit-homatropine (HYCODAN) 5-1.5 MG/5ML syrup Take 5 mLs by mouth every evening for 1 week, then as needed (Patient not taking: Reported on 08/01/2021) 120 mL 0   pseudoephedrine (SUDAFED) 30 MG tablet Take 30 mg by mouth every 4 (four) hours as needed for congestion. (Patient not taking: Reported on 08/01/2021)     No facility-administered medications prior to visit.    ROS Review of Systems  Objective:  BP 120/76 (BP Location: Left Arm, Patient Position: Sitting, Cuff Size: Large)   Pulse 68   Temp 97.6 F (36.4 C) (Oral)   Ht '5\' 9"'$  (1.753 m)   Wt 168 lb (76.2 kg)   SpO2 96%   BMI 24.81 kg/m   Physical Exam Vitals reviewed.  HENT:     Nose: Nose normal.     Mouth/Throat:     Mouth: Mucous membranes are moist.  Eyes:     General: No scleral icterus.    Conjunctiva/sclera: Conjunctivae normal.  Cardiovascular:     Rate and Rhythm: Normal rate and regular rhythm.     Heart sounds: No murmur heard. Pulmonary:     Effort: Pulmonary effort is normal.     Breath sounds: No stridor. No wheezing, rhonchi or rales.  Abdominal:  General: Abdomen is flat.     Palpations: There is no mass.     Tenderness: There is no abdominal tenderness. There is no guarding or rebound.     Hernia: No hernia is present. There is no hernia in the left inguinal area or right inguinal area.  Genitourinary:    Pubic Area: No rash.      Penis: Normal and circumcised.      Testes: Normal.     Epididymis:     Right: Normal.     Left: Normal.     Prostate: Normal. Not enlarged, not tender and no nodules present.     Rectum: Normal. Guaiac result negative. No mass, tenderness, anal fissure, external hemorrhoid or internal  hemorrhoid. Normal anal tone.  Musculoskeletal:     Cervical back: Neck supple.  Lymphadenopathy:     Cervical: No cervical adenopathy.     Lower Body: No right inguinal adenopathy. No left inguinal adenopathy.  Neurological:     Mental Status: He is alert.     Lab Results  Component Value Date   WBC 5.6 04/12/2021   HGB 15.3 04/12/2021   HCT 46 04/12/2021   PLT 222 04/12/2021   GLUCOSE 96 08/01/2021   CHOL 163 04/12/2021   TRIG 86 04/12/2021   HDL 54 04/12/2021   LDLCALC 93 04/12/2021   ALT 36 04/12/2021   AST 30 04/12/2021   NA 142 08/01/2021   K 3.6 08/01/2021   CL 104 08/01/2021   CREATININE 1.24 08/01/2021   BUN 18 08/01/2021   CO2 30 08/01/2021   TSH 2.83 08/01/2021   PSA 1.8 04/12/2021   HGBA1C 5.8 04/12/2021     Assessment & Plan:   David Mclaughlin was seen today for annual exam, hyperlipidemia and hypothyroidism.  Diagnoses and all orders for this visit:  Encounter for general adult medical examination with abnormal findings  Acquired hypothyroidism -     TSH; Future -     TSH  Dyslipidemia, goal LDL below 100 -     TSH; Future -     TSH  Stage 3a chronic kidney disease (HCC) -     Basic metabolic panel; Future -     Urinalysis, Routine w reflex microscopic; Future -     Urinalysis, Routine w reflex microscopic -     Basic metabolic panel  Other orders -     Zoster Recombinant (Shingrix )   I have discontinued David Lente. Mclaughlin's pseudoephedrine, ibuprofen, HYDROcodone bit-homatropine, and doxycycline. I am also having him maintain his Co Q 10, loratadine, fluticasone, rosuvastatin, levothyroxine, and esomeprazole.  No orders of the defined types were placed in this encounter.    Follow-up: Return in about 6 months (around 02/01/2022).  Scarlette Calico, MD

## 2021-10-19 ENCOUNTER — Other Ambulatory Visit (HOSPITAL_COMMUNITY): Payer: Self-pay

## 2021-10-19 DIAGNOSIS — R42 Dizziness and giddiness: Secondary | ICD-10-CM | POA: Insufficient documentation

## 2021-10-19 MED ORDER — DIAZEPAM 2 MG PO TABS
2.0000 mg | ORAL_TABLET | Freq: Four times a day (QID) | ORAL | 0 refills | Status: AC | PRN
  Filled 2021-10-19: qty 20, 5d supply, fill #0

## 2021-10-20 ENCOUNTER — Other Ambulatory Visit (HOSPITAL_COMMUNITY): Payer: Self-pay

## 2021-10-20 MED ORDER — TRIAMTERENE-HCTZ 37.5-25 MG PO CAPS
1.0000 | ORAL_CAPSULE | Freq: Every day | ORAL | 5 refills | Status: DC
  Filled 2021-10-20: qty 30, 30d supply, fill #0

## 2021-10-25 ENCOUNTER — Other Ambulatory Visit: Payer: Self-pay | Admitting: Otolaryngology

## 2021-10-25 DIAGNOSIS — R519 Headache, unspecified: Secondary | ICD-10-CM

## 2021-10-25 DIAGNOSIS — R42 Dizziness and giddiness: Secondary | ICD-10-CM

## 2021-12-10 ENCOUNTER — Other Ambulatory Visit: Payer: Self-pay | Admitting: Internal Medicine

## 2021-12-24 IMAGING — CT CT CARDIAC CORONARY ARTERY CALCIUM SCORE
3 series · 14 of 20 positions shown, 16 images · non-contrast
Comparison: Prior calcium score study on 03/24/2019

Addendum:
CLINICAL DATA: Cardiovascular Disease Risk stratification

EXAM:
Coronary Calcium Score
TECHNIQUE: A gated, non-contrast computed tomography scan of the heart was
performed using 3mm slice thickness. Axial images were analyzed on a
dedicated workstation. Calcium scoring of the coronary arteries was
performed using the Agatston method.

[Series 3: 2 hrt calcium · axial · 0.39mm/px · z∈[-147,-39]mm · 6 of 52 slices shown, 8 images]
[im 8/52  vessel]
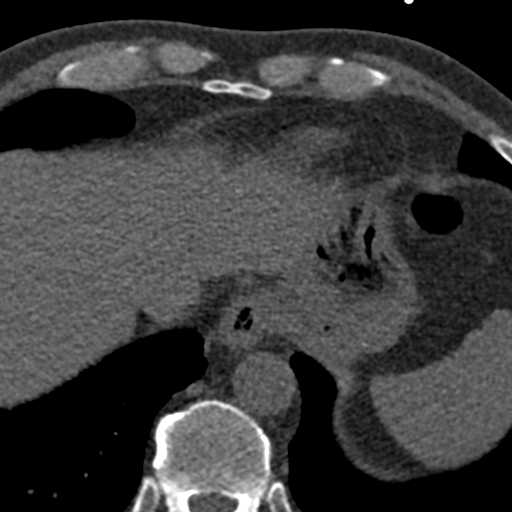
[im 8/52  lung]
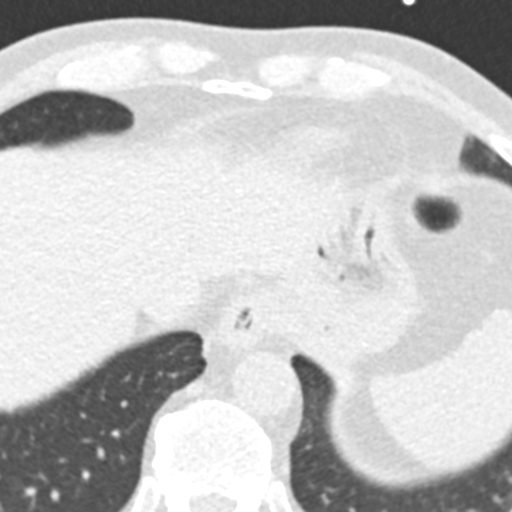
[im 15/52  vessel]
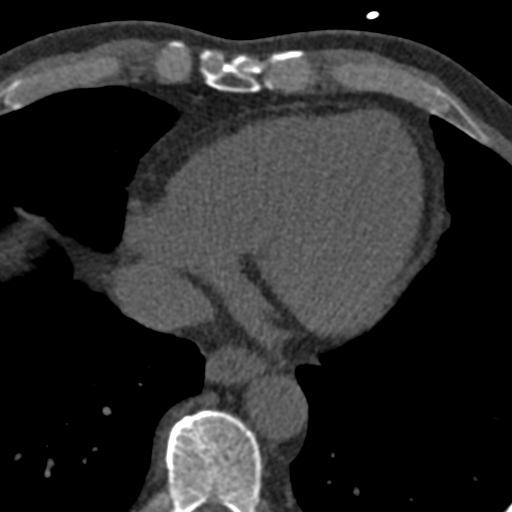
[im 22/52  vessel]
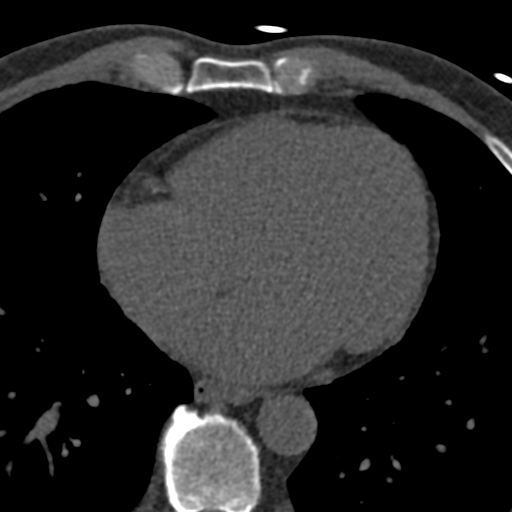
[im 30/52  vessel]
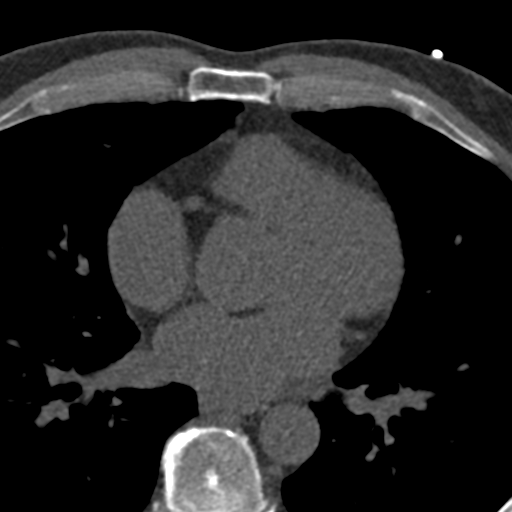
[im 37/52  vessel]
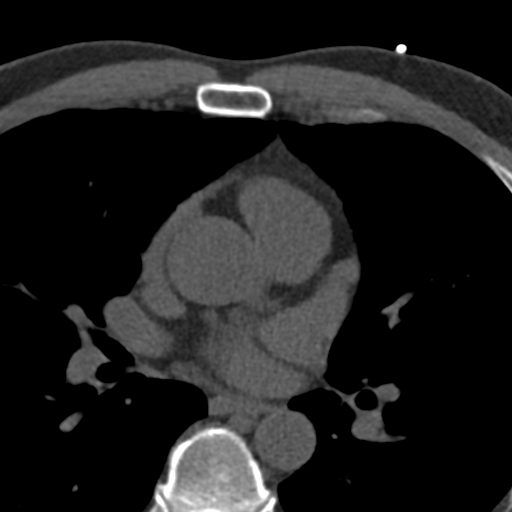
[im 37/52  lung]
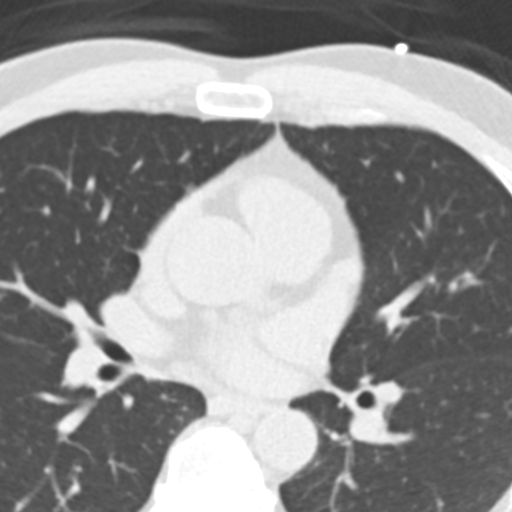
[im 44/52  vessel]
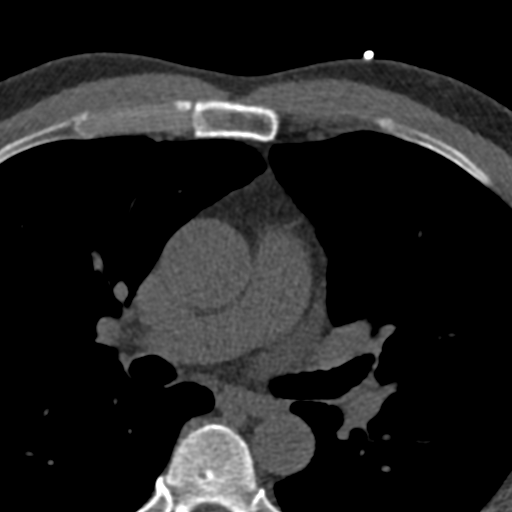

[Series 4: 2 soft full fov 72 % · axial · 0.71mm/px · z∈[-144,-72]mm · 4 of 41 slices shown]
[im 9/41  vessel]
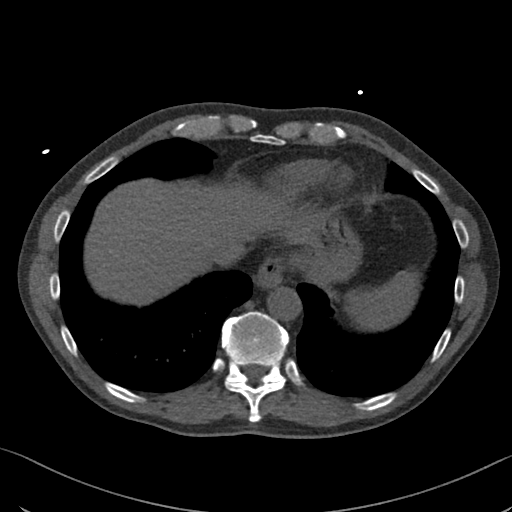
[im 17/41  vessel]
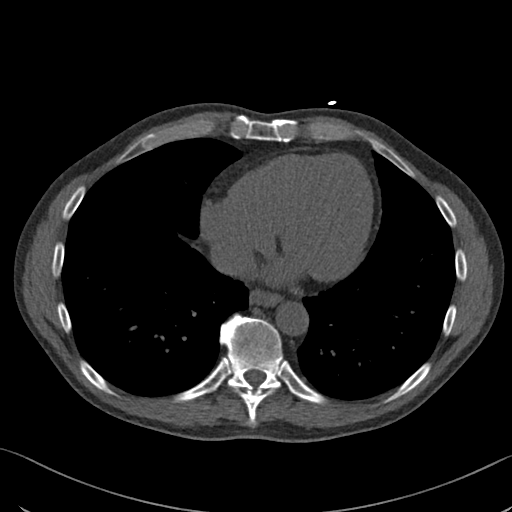
[im 25/41  vessel]
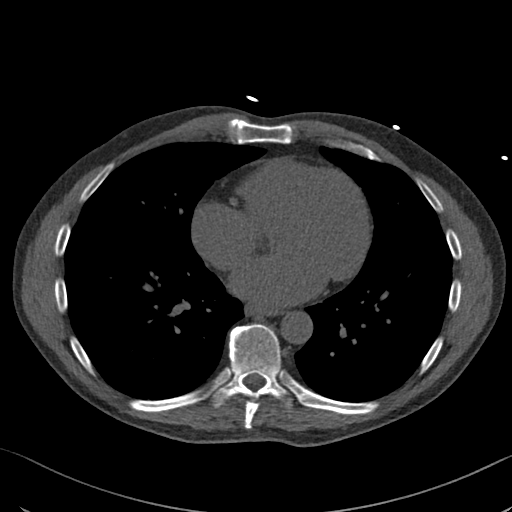
[im 33/41  vessel]
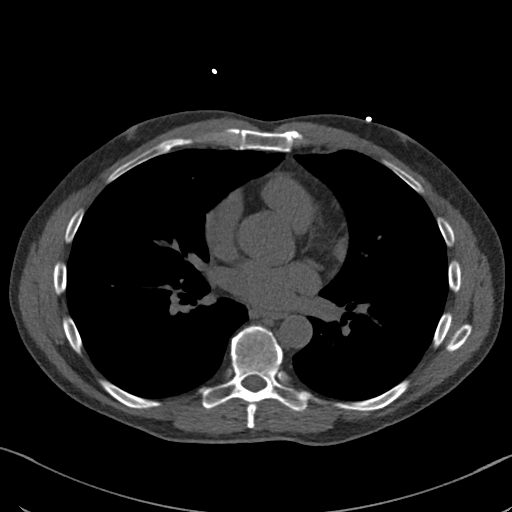

[Series 6: 2 lungs 72 % · axial · 0.71mm/px · z∈[-144,-72]mm · 4 of 41 slices shown]
[im 9/41  vessel]
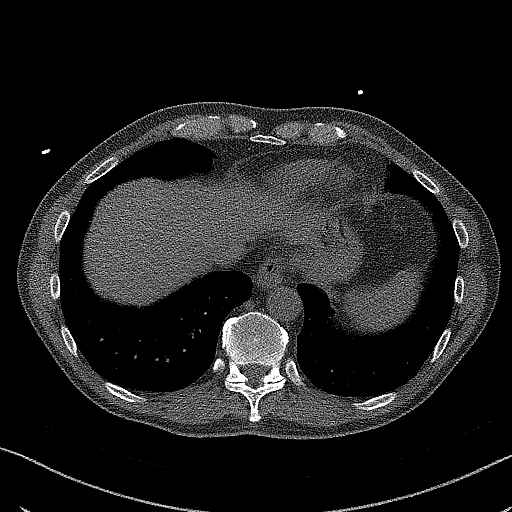
[im 17/41  vessel]
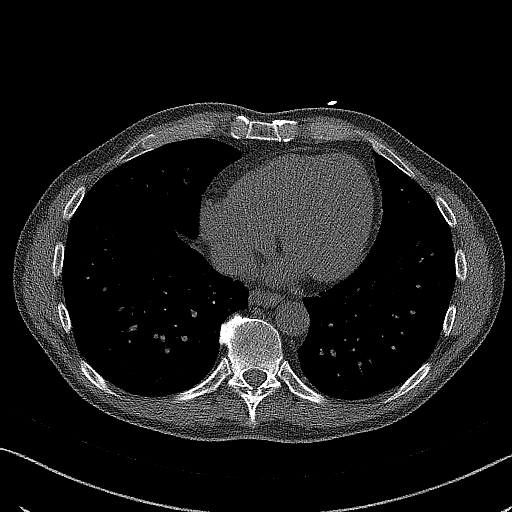
[im 25/41  vessel]
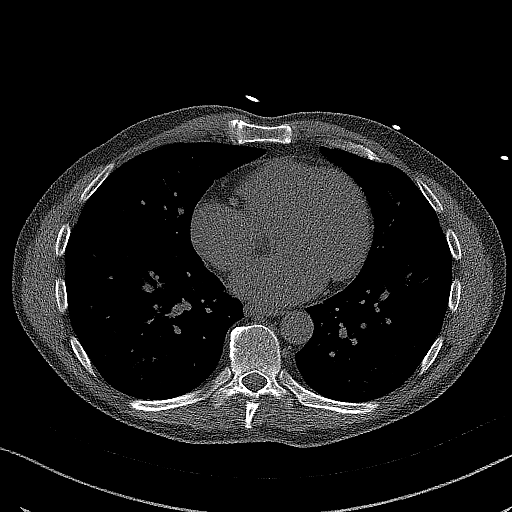
[im 33/41  vessel]
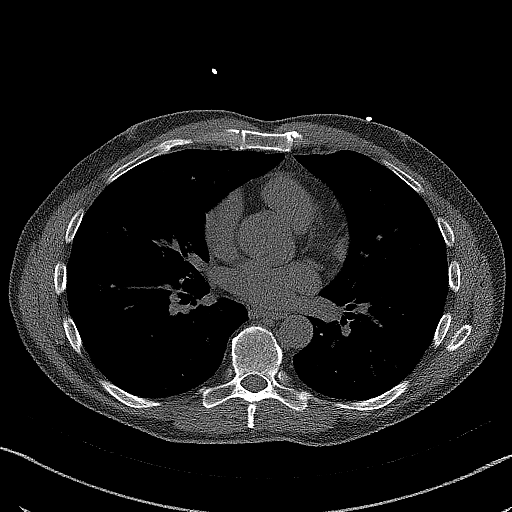

[14 of 20 positions shown; findings below may reference images not displayed]

FINDINGS: Coronary arteries: Normal origins.

Coronary Calcium Score:

Left main:

Left anterior descending artery:

Left circumflex artery: 0

Right coronary artery:

Total:

Percentile: 52nd

Pericardium: Normal.

Ascending Aorta: Normal caliber.

Non-cardiac: See separate report from [REDACTED].
IMPRESSION: Coronary calcium score of 49.9. This was 62nd percentile for age-,
race-, and sex-matched controls.



If CAC=0, it is reasonable to withhold statin therapy and reassess
in 5 to 10 years, as long as higher risk conditions are absent
(diabetes mellitus, family history of premature CHD in first degree
relatives (males <55 years; females <65 years), cigarette smoking,
or LDL >=190 mg/dL).

If CAC is 1 to 99, it is reasonable to initiate statin therapy for
patients >=55 years of age.

If CAC is >=100 or >=75th percentile, it is reasonable to initiate
statin therapy at any age.

Cardiology referral should be considered for patients with CAC
scores >=400 or >=75th percentile.

*1953 AHA/ACC/AACVPR/AAPA/ABC/ALSEIKIENE/POLIN/JUMPER/[place]/SONJA ZOFKA/JIM/ADEM
Guideline on the Management of Blood Cholesterol: A Report of the
American College of Cardiology/American Heart Association Task Force
on Clinical Practice Guidelines. J Am Coll Cardiol.
3961;73(24):5415-5553.

EXAM:
OVER-READ INTERPRETATION  CT CHEST

The following report is an over-read performed by radiologist Dr.
over-read does not include interpretation of cardiac or coronary
anatomy or pathology. The coronary calcium score interpretation by
the cardiologist is attached.
FINDINGS: No significant noncardiac vascular findings. Visualized mediastinum
and hilar regions are unremarkable. Visualized lungs show no
evidence of pulmonary edema, consolidation, pneumothorax, nodule or
pleural fluid. Visualized upper abdomen and bony structures are
unremarkable.
IMPRESSION: No significant incidental findings.

*** End of Addendum ***
FINDINGS: Coronary arteries: Normal origins.

Coronary Calcium Score:

Left main:

Left anterior descending artery:

Left circumflex artery: 0

Right coronary artery:

Total:

Percentile: 52nd

Pericardium: Normal.

Ascending Aorta: Normal caliber.

Non-cardiac: See separate report from [REDACTED].
IMPRESSION: Coronary calcium score of 49.9. This was 62nd percentile for age-,
race-, and sex-matched controls.



If CAC=0, it is reasonable to withhold statin therapy and reassess
in 5 to 10 years, as long as higher risk conditions are absent
(diabetes mellitus, family history of premature CHD in first degree
relatives (males <55 years; females <65 years), cigarette smoking,
or LDL >=190 mg/dL).

If CAC is 1 to 99, it is reasonable to initiate statin therapy for
patients >=55 years of age.

If CAC is >=100 or >=75th percentile, it is reasonable to initiate
statin therapy at any age.

Cardiology referral should be considered for patients with CAC
scores >=400 or >=75th percentile.

*1953 AHA/ACC/AACVPR/AAPA/ABC/ALSEIKIENE/POLIN/JUMPER/[place]/SONJA ZOFKA/JIM/ADEM
Guideline on the Management of Blood Cholesterol: A Report of the
American College of Cardiology/American Heart Association Task Force
on Clinical Practice Guidelines. J Am Coll Cardiol.
3961;73(24):5415-5553.

## 2022-02-07 ENCOUNTER — Other Ambulatory Visit (HOSPITAL_COMMUNITY): Payer: Self-pay

## 2022-02-08 ENCOUNTER — Other Ambulatory Visit (HOSPITAL_COMMUNITY): Payer: Self-pay

## 2022-02-08 MED ORDER — ZOSTER VAC RECOMB ADJUVANTED 50 MCG/0.5ML IM SUSR
0.5000 mL | INTRAMUSCULAR | 0 refills | Status: DC
  Filled 2022-02-08 – 2022-02-10 (×2): qty 0.5, 1d supply, fill #0

## 2022-02-09 ENCOUNTER — Other Ambulatory Visit (HOSPITAL_COMMUNITY): Payer: Self-pay

## 2022-02-10 ENCOUNTER — Other Ambulatory Visit (HOSPITAL_COMMUNITY): Payer: Self-pay

## 2022-02-15 ENCOUNTER — Ambulatory Visit: Payer: PRIVATE HEALTH INSURANCE | Admitting: Family Medicine

## 2022-02-15 ENCOUNTER — Ambulatory Visit: Payer: Self-pay

## 2022-02-15 VITALS — BP 124/84 | Ht 69.0 in | Wt 160.0 lb

## 2022-02-15 DIAGNOSIS — M79672 Pain in left foot: Secondary | ICD-10-CM

## 2022-02-15 NOTE — Patient Instructions (Signed)
You have plantar fasciitis Take tylenol and/or aleve as needed for pain Plantar fascia stretch for 20-30 seconds (do 3 of these) in morning Lowering/raise on a step exercises 3 x 10 once or twice a day - this is very important for long term recovery. Ice heel for 15 minutes as needed. Avoid flat shoes/barefoot walking as much as possible. Inserts are important (dr. Zoe Lan active series, spencos, our green insoles, custom orthotics). [place] sock when sleeping to help keep this stretched out. Start shockwave therapy (first treatment done today) - follow up in 1 week for second of 4 treatments. Physical therapy is also an option.

## 2022-02-15 NOTE — Progress Notes (Signed)
David Mclaughlin - 67 y.o. male MRN [id number]  Date of birth: [birth year]    CHIEF COMPLAINT:   L foot pain    SUBJECTIVE:   HPI:  Pleasant 67 year old male comes to clinic as a new patient to be evaluated for left foot pain.  He says that pain started around the Fourth of July of last year.  Pain is most prominent at the plantar aspect of the foot.  He describes it as sharp and stabbing.  He says initially started laterally on the plantar aspect of the foot but now is located more medially in the plantar aspect the foot.  It really has been waxing and waning over the last several months.  He intermittently gets to go away by doing some stretches, heat, ice, topical Voltaren when it flares up.  However over the last weekend he said it became excruciatingly more painful.  Is now affecting his ability to walk.  It woke him up in the middle of the night several times over the weekend.  He would like to discuss more definitive treatment options today.  ROS:     See HPI  PERTINENT  PMH / PSH FH / / SH:  Past Medical, Surgical, Social, and Family History Reviewed & Updated in the EMR.  Pertinent findings include:  Thyroid disease  OBJECTIVE: BP 124/84   Ht 5\' 9"  (1.753 m)   Wt 160 lb (72.6 kg)   BMI 23.63 kg/m   Physical Exam:  Vital signs are reviewed.  GEN: Alert and oriented, NAD Pulm: Breathing unlabored PSY: normal mood, congruent affect  MSK: Left foot -no obvious deformity of the foot or ankle.  No soft tissue swelling.  No erythema.  He is mildly tender to palpation at the posterior to the medial malleolus over post tib tendon, but nontender to palpation over the lateral malleolus, navicular, or base of the fifth metatarsal.  He is tender to palpation at the medial aspect of the base of the calcaneus near the plantar fascia insertion.  He has full range of motion of the ankle in all directions.  5/5 strength throughout.  He does have a positive Windlass test.  Feet analysis -loss  of the longitudinal arch height bilaterally, left greater than right.  He has suboptimal posterior tibialis firing with raising up on his toes bilaterally, again left greater than right.  Ultrasound: [place] L Foot and ankle:  - Tibial posterior: Evaluated in longitudinal and transverse planes.  Tendon intact with no evidence of tenosynovitis - Plantar Fascia: Thickness measured 0.52 cm near the insertion on the calcaneus  Impression: Chronically thickened plantar fascia consistent with plantar fasciitis   ASSESSMENT & PLAN:  1.  Plantar fasciitis left foot  -Discussed treatment options with the patient today.  He has already tried a number of conservative measures including over-the-counter inserts, intermittent stretching, icing, heat, and topical Voltaren gel.  He is amenable to trying shockwave therapy today so we will start with his first session today.  He is also amenable to trying greens orthotics (did not tolerate scaphoid pads in these currently).  He will follow-up in 1 week for next shockwave treatment.  Procedure: ECSWT Indications:  Plantar Fasciitis   Procedure Details Consent: Risks of procedure as well as the alternatives and risks of each were explained to the patient.  Written consent for procedure obtained. Time Out: Verified patient identification, verified procedure, site was marked, verified correct patient position, medications/allergies/relevent history reviewed.  The area was cleaned with alcohol  swab.     The left foot was targeted for Extracorporeal shockwave therapy.    Preset: Plantar Fasciitis Power Level: 70 for 1000 impulses, 80 for 1500 Frequency: 10 Impulse/cycles: 2500 Head size: Large   Patient tolerated procedure well without immediate complications  Arvella Nigh, MD PGY-4, Sports Medicine Fellow [facility]

## 2022-02-16 ENCOUNTER — Encounter: Payer: Self-pay | Admitting: Family Medicine

## 2022-03-23 ENCOUNTER — Other Ambulatory Visit: Payer: Self-pay | Admitting: Internal Medicine

## 2022-03-23 ENCOUNTER — Telehealth: Payer: Self-pay | Admitting: Internal Medicine

## 2022-03-23 DIAGNOSIS — E039 Hypothyroidism, unspecified: Secondary | ICD-10-CM

## 2022-03-23 MED ORDER — LEVOTHYROXINE SODIUM 100 MCG PO TABS: 100.0000 ug | ORAL_TABLET | Freq: Every day | ORAL | 0 refills | Status: DC

## 2022-03-23 NOTE — Telephone Encounter (Signed)
LVM to call back to schedule an OV for meds and TSH levels

## 2022-03-23 NOTE — Telephone Encounter (Signed)
Spoke with patient as he is very worried about not having his medication and needing a new appointment.  Patient stated he has 2 tablets left and is willing to come in for an OV but unable to do so until 4/2 or 4/3 due to his schedule. Patient was scheduled for 4/2 @ 3:20p. On Dr. Ronnald Ramp schedule.  Advised patient that I will send in 18 days of tablets if MD approves, as this is essential for him to get his labs and review plan Awaiting MD approval to send in and told pt will call back today once know plan from provider

## 2022-03-23 NOTE — Telephone Encounter (Signed)
Patient called back to see if there was an answer on his medication. I let patient know that provider had sent in RX for 30 days to his [facility]. Patient appreciative and not additional questions at this time.

## 2022-03-23 NOTE — Telephone Encounter (Signed)
Prescription Request  03/23/2022  LOV: 08/01/2021  What is the name of the medication or equipment? levothyroxine (SYNTHROID) 100 MCG tablet   Have you contacted your pharmacy to request a refill? Yes   Which pharmacy would you like this sent to?  [address], [place] [address] Phone: [phone] Fax: [phone]    Patient notified that their request is being sent to the clinical staff for review and that they should receive a response within 2 business days.   Please advise at Mobile [phone] (mobile)   Patient spoke with pharmacy and they said they were unable to contact Dr. Ronnald Ramp. He has two pills left and would like it to be filled ASAP. He also gave a phone number to contact the pharmacy if needed, [phone].

## 2022-03-23 NOTE — Telephone Encounter (Signed)
He is due for an office visit and TSH level

## 2022-04-11 ENCOUNTER — Ambulatory Visit: Payer: PRIVATE HEALTH INSURANCE | Admitting: Internal Medicine

## 2022-04-11 ENCOUNTER — Encounter: Payer: Self-pay | Admitting: Internal Medicine

## 2022-04-11 ENCOUNTER — Other Ambulatory Visit (HOSPITAL_COMMUNITY): Payer: Self-pay

## 2022-04-11 VITALS — BP 128/84 | HR 76 | Temp 98.1°F | Ht 69.0 in | Wt 169.0 lb

## 2022-04-11 DIAGNOSIS — G43909 Migraine, unspecified, not intractable, without status migrainosus: Secondary | ICD-10-CM

## 2022-04-11 DIAGNOSIS — N182 Chronic kidney disease, stage 2 (mild): Secondary | ICD-10-CM

## 2022-04-11 DIAGNOSIS — N1831 Chronic kidney disease, stage 3a: Secondary | ICD-10-CM | POA: Diagnosis not present

## 2022-04-11 DIAGNOSIS — E039 Hypothyroidism, unspecified: Secondary | ICD-10-CM | POA: Diagnosis not present

## 2022-04-11 DIAGNOSIS — E785 Hyperlipidemia, unspecified: Secondary | ICD-10-CM | POA: Diagnosis not present

## 2022-04-11 LAB — CBC WITH DIFFERENTIAL/PLATELET
Basophils Absolute: 0.1 10*3/uL (ref 0.0–0.1)
Basophils Relative: 1 % (ref 0.0–3.0)
Eosinophils Absolute: 0.1 10*3/uL (ref 0.0–0.7)
Eosinophils Relative: 1.3 % (ref 0.0–5.0)
HCT: 42.8 % (ref 39.0–52.0)
Hemoglobin: 14.7 g/dL (ref 13.0–17.0)
Lymphocytes Relative: 24.3 % (ref 12.0–46.0)
Lymphs Abs: 1.6 10*3/uL (ref 0.7–4.0)
MCHC: 34.3 g/dL (ref 30.0–36.0)
MCV: 87.5 fl (ref 78.0–100.0)
Monocytes Absolute: 0.6 10*3/uL (ref 0.1–1.0)
Monocytes Relative: 8.5 % (ref 3.0–12.0)
Neutro Abs: 4.3 10*3/uL (ref 1.4–7.7)
Neutrophils Relative %: 64.9 % (ref 43.0–77.0)
Platelets: 240 10*3/uL (ref 150.0–400.0)
RBC: 4.89 Mil/uL (ref 4.22–5.81)
RDW: 14.3 % (ref 11.5–15.5)
WBC: 6.7 10*3/uL (ref 4.0–10.5)

## 2022-04-11 LAB — TSH: TSH: 3.08 u[IU]/mL (ref 0.35–5.50)

## 2022-04-11 MED ORDER — UBRELVY 100 MG PO TABS
1.0000 | ORAL_TABLET | Freq: Every day | ORAL | 3 refills | Status: DC | PRN
Start: 2022-04-11 — End: 2022-08-09
  Filled 2022-04-11: qty 9, 9d supply, fill #0
  Filled 2022-04-12: qty 9, 30d supply, fill #0

## 2022-04-11 NOTE — Patient Instructions (Signed)

## 2022-04-11 NOTE — Progress Notes (Unsigned)
Subjective:  Patient ID: David Mclaughlin, male    DOB: 1955-11-29  Age: 67 y.o. MRN: [id number]  CC: Hypothyroidism and Hypertension   HPI Candler Denger Seyler presents for f/up -    He has a history of migraine headaches and tells me he has about had 3 headaches a month.  He tried his wife's Maxalt but it caused excessive sedation.  He says his headaches have not changed.  He describes it as a pressure sensation on the top of his head with no nausea, vomiting, paresthesias, dizziness, or lightheadedness.  Outpatient Medications Prior to Visit  Medication Sig Dispense Refill   Coenzyme Q10 (CO Q 10) 100 MG CAPS Take 1 capsule by mouth daily.     diazepam (VALIUM) 2 MG tablet Take 1 tablet (2 mg total) by mouth every 6 (six) hours as needed for anxiety 20 tablet 0   esomeprazole (NEXIUM) 40 MG capsule Take 1 capsule by mouth 2 times daily before meals. 180 capsule 0   fluticasone (FLONASE) 50 MCG/ACT nasal spray Place 1 spray into both nostrils daily.     loratadine (CLARITIN) 10 MG tablet Take 10 mg by mouth daily.     triamterene-hydrochlorothiazide (DYAZIDE) 37.5-25 MG capsule Take 1 capsule by mouth daily. 30 capsule 5   levothyroxine (SYNTHROID) 100 MCG tablet Take 1 tablet (100 mcg total) by mouth daily before breakfast. 30 tablet 0   rosuvastatin (CRESTOR) 5 MG tablet Take 1 tablet (5 mg total) by mouth daily. 90 tablet 1   Zoster Vaccine Adjuvanted [facility]) injection Inject 0.5 mLs into the muscle. (Patient not taking: Reported on 04/11/2022) 0.5 mL 0   No facility-administered medications prior to visit.    ROS Review of Systems  Constitutional:  Positive for fatigue. Negative for appetite change, chills, diaphoresis and unexpected weight change.  HENT: Negative.    Eyes: Negative.   Cardiovascular:  Negative for chest pain, palpitations and leg swelling.  Gastrointestinal:  Negative for abdominal pain, constipation, diarrhea, nausea and vomiting.  Endocrine: Negative.    Genitourinary: Negative.  Negative for difficulty urinating.  Musculoskeletal: Negative.  Negative for myalgias and neck pain.  Skin: Negative.   Neurological:  Positive for headaches. Negative for dizziness, weakness and light-headedness.  Hematological:  Negative for adenopathy. Does not bruise/bleed easily.  Psychiatric/Behavioral: Negative.      Objective:  BP 128/84 (BP Location: Left Arm, Patient Position: Sitting, Cuff Size: Normal)   Pulse 76   Temp 98.1 F (36.7 C) (Oral)   Ht 5\' 9"  (1.753 m)   Wt 169 lb (76.7 kg)   SpO2 96%   BMI 24.96 kg/m   BP Readings from Last 3 Encounters:  04/11/22 128/84  02/15/22 124/84  08/01/21 120/76    Wt Readings from Last 3 Encounters:  04/11/22 169 lb (76.7 kg)  02/15/22 160 lb (72.6 kg)  08/01/21 168 lb (76.2 kg)    Physical Exam Vitals reviewed.  Constitutional:      Appearance: He is not ill-appearing.  HENT:     Mouth/Throat:     Mouth: Mucous membranes are moist.  Eyes:     General: No scleral icterus.    Conjunctiva/sclera: Conjunctivae normal.  Cardiovascular:     Rate and Rhythm: Normal rate and regular rhythm.     Heart sounds: No murmur heard. Pulmonary:     Effort: Pulmonary effort is normal.     Breath sounds: No stridor. No wheezing, rhonchi or rales.  Abdominal:     General: Abdomen  is flat.     Palpations: There is no mass.     Tenderness: There is no abdominal tenderness. There is no guarding.     Hernia: No hernia is present.  Musculoskeletal:        General: Normal range of motion.     Cervical back: Neck supple.     Right lower leg: No edema.     Left lower leg: No edema.  Lymphadenopathy:     Cervical: No cervical adenopathy.  Skin:    General: Skin is warm and dry.     Coloration: Skin is not pale.  Neurological:     General: No focal deficit present.     Mental Status: He is alert. Mental status is at baseline.     Motor: No weakness.     Gait: Gait normal.     Deep Tendon Reflexes:  Reflexes normal.  Psychiatric:        Mood and Affect: Mood normal.        Behavior: Behavior normal.     Lab Results  Component Value Date   WBC 6.7 04/11/2022   HGB 14.7 04/11/2022   HCT 42.8 04/11/2022   PLT 240.0 04/11/2022   GLUCOSE 92 04/11/2022   CHOL 148 04/11/2022   TRIG 112.0 04/11/2022   HDL 46.90 04/11/2022   LDLCALC 79 04/11/2022   ALT 15 04/11/2022   AST 19 04/11/2022   NA 137 04/11/2022   K 4.0 04/11/2022   CL 101 04/11/2022   CREATININE 1.19 04/11/2022   BUN 18 04/11/2022   CO2 32 04/11/2022   TSH 3.08 04/11/2022   PSA 1.8 04/12/2021   HGBA1C 5.8 04/12/2021    CT CARDIAC CALCIUM SCORING (DOCTOR'S DAY ONLY)  Addendum Date: 06/01/2020   ADDENDUM REPORT: 06/01/2020 09:09 EXAM: OVER-READ INTERPRETATION  CT CHEST The following report is an over-read performed by radiologist Dr. Barnetta Hammersmith [facility] on 06/01/2020. This over-read does not include interpretation of cardiac or coronary anatomy or pathology. The coronary calcium score interpretation by the cardiologist is attached. COMPARISON:  Prior calcium score study on 03/24/2019 FINDINGS: No significant noncardiac vascular findings. Visualized mediastinum and hilar regions are unremarkable. Visualized lungs show no evidence of pulmonary edema, consolidation, pneumothorax, nodule or pleural fluid. Visualized upper abdomen and bony structures are unremarkable. IMPRESSION: No significant incidental findings. Electronically Signed   By: Aletta Edouard M.D.   On: 06/01/2020 09:09   Result Date: 06/01/2020 CLINICAL DATA:  Cardiovascular Disease Risk stratification EXAM: Coronary Calcium Score TECHNIQUE: A gated, non-contrast computed tomography scan of the heart was performed using 40mm slice thickness. Axial images were analyzed on a dedicated workstation. Calcium scoring of the coronary arteries was performed using the Agatston method. FINDINGS: Coronary arteries: Normal origins. Coronary Calcium Score:  Left main: 23.2 Left anterior descending artery: 1.53 Left circumflex artery: 0 Right coronary artery: 25.2 Total: 49.9 Percentile: 52nd Pericardium: Normal. Ascending Aorta: Normal caliber. Non-cardiac: See separate report from [facility]. IMPRESSION: Coronary calcium score of 49.9. This was 62nd percentile for age-, race-, and sex-matched controls. RECOMMENDATIONS: Coronary artery calcium (CAC) score is a strong predictor of incident coronary heart disease (CHD) and provides predictive information beyond traditional risk factors. CAC scoring is reasonable to use in the decision to withhold, postpone, or initiate statin therapy in intermediate-risk or selected borderline-risk asymptomatic adults (age 38-75 years and LDL-C >=70 to <190 mg/dL) who do not have diabetes or established atherosclerotic cardiovascular disease (ASCVD).* In intermediate-risk (10-year ASCVD risk >=7.5% to <20%)  adults or selected borderline-risk (10-year ASCVD risk >=5% to <7.5%) adults in whom a CAC score is measured for the purpose of making a treatment decision the following recommendations have been made: If CAC=0, it is reasonable to withhold statin therapy and reassess in 5 to 10 years, as long as higher risk conditions are absent (diabetes mellitus, family history of premature CHD in first degree relatives (males <55 years; females <65 years), cigarette smoking, or LDL >=190 mg/dL). If CAC is 1 to 99, it is reasonable to initiate statin therapy for patients >=48 years of age. If CAC is >=100 or >=75th percentile, it is reasonable to initiate statin therapy at any age. Cardiology referral should be considered for patients with CAC scores >=400 or >=75th percentile. *2018 AHA/ACC/AACVPR/AAPA/ABC/ACPM/ADA/AGS/APhA/ASPC/NLA/PCNA Guideline on the Management of Blood Cholesterol: A Report of the American College of Cardiology/American Heart Association Task Force on Clinical Practice Guidelines. J Am Coll Cardiol.  2019;73(24):3168-3209. Fransico Him, MD Electronically Signed: By: Fransico Him On: 05/31/2020 17:51    Assessment & Plan:  Acquired hypothyroidism- He is euthyroid. -     CBC with Differential/Platelet; Future -     TSH; Future -     Hepatic function panel; Future -     Levothyroxine Sodium; Take 1 tablet (100 mcg total) by mouth daily before breakfast.  Dispense: 90 tablet; Refill: 1  Stage 3a chronic kidney disease -     CBC with Differential/Platelet; Future -     Basic metabolic panel; Future  Dyslipidemia, goal LDL below 100- LDL goal achieved. Doing well on the statin  -     Lipid panel; Future -     TSH; Future -     Hepatic function panel; Future -     Rosuvastatin Calcium; Take 1 tablet (5 mg total) by mouth daily.  Dispense: 90 tablet; Refill: 1  Episodic migraine- Will treat with a CGRP antagonist. Roselyn Meier; Take 1 tablet (100 mg total) by mouth daily as needed.  Dispense: 10 tablet; Refill: 3  CKD (chronic kidney disease) stage 2, GFR 60-89 ml/min- His renal function is stable.     Follow-up: Return in about 4 months (around 08/11/2022).  Scarlette Calico, MD

## 2022-04-12 ENCOUNTER — Other Ambulatory Visit (HOSPITAL_COMMUNITY): Payer: Self-pay

## 2022-04-12 DIAGNOSIS — N182 Chronic kidney disease, stage 2 (mild): Secondary | ICD-10-CM | POA: Insufficient documentation

## 2022-04-12 LAB — HEPATIC FUNCTION PANEL
ALT: 15 U/L (ref 0–53)
AST: 19 U/L (ref 0–37)
Albumin: 4.1 g/dL (ref 3.5–5.2)
Alkaline Phosphatase: 67 U/L (ref 39–117)
Bilirubin, Direct: 0.2 mg/dL (ref 0.0–0.3)
Total Bilirubin: 1.2 mg/dL (ref 0.2–1.2)
Total Protein: 7.3 g/dL (ref 6.0–8.3)

## 2022-04-12 LAB — BASIC METABOLIC PANEL
BUN: 18 mg/dL (ref 6–23)
CO2: 32 mEq/L (ref 19–32)
Calcium: 9.4 mg/dL (ref 8.4–10.5)
Chloride: 101 mEq/L (ref 96–112)
Creatinine, Ser: 1.19 mg/dL (ref 0.40–1.50)
GFR: 64.51 mL/min (ref >60.00)
Glucose, Bld: 92 mg/dL (ref 70–99)
Potassium: 4 mEq/L (ref 3.5–5.1)
Sodium: 137 mEq/L (ref 135–145)

## 2022-04-12 LAB — LIPID PANEL
Cholesterol: 148 mg/dL (ref 0–200)
HDL: 46.9 mg/dL (ref >39.00)
LDL Cholesterol: 79 mg/dL (ref 0–99)
NonHDL: 101.11
Total CHOL/HDL Ratio: 3
Triglycerides: 112 mg/dL (ref 0.0–149.0)
VLDL: 22.4 mg/dL (ref 0.0–40.0)

## 2022-04-12 MED ORDER — ROSUVASTATIN CALCIUM 5 MG PO TABS
5.0000 mg | ORAL_TABLET | Freq: Every day | ORAL | 1 refills | Status: DC
Start: 2022-04-12 — End: 2022-10-20
  Filled 2022-04-12: qty 30, 30d supply, fill #0

## 2022-04-12 MED ORDER — LEVOTHYROXINE SODIUM 100 MCG PO TABS
100.0000 ug | ORAL_TABLET | Freq: Every day | ORAL | 1 refills | Status: DC
Start: 2022-04-12 — End: 2022-04-20
  Filled 2022-04-12: qty 30, 30d supply, fill #0

## 2022-04-17 ENCOUNTER — Other Ambulatory Visit (HOSPITAL_COMMUNITY): Payer: Self-pay

## 2022-04-18 ENCOUNTER — Encounter: Payer: Self-pay | Admitting: Internal Medicine

## 2022-04-18 ENCOUNTER — Other Ambulatory Visit (HOSPITAL_COMMUNITY): Payer: Self-pay

## 2022-04-19 ENCOUNTER — Other Ambulatory Visit (HOSPITAL_COMMUNITY): Payer: Self-pay

## 2022-04-19 ENCOUNTER — Telehealth: Payer: Self-pay

## 2022-04-19 NOTE — Telephone Encounter (Signed)
Patient Advocate Encounter  Prior Authorization for Bernita Raisin 100MG  tablets  has been approved.    PA# [id number] Effective dates: 04/19/22 through 07/19/22

## 2022-04-19 NOTE — Telephone Encounter (Signed)
Pharmacy Patient Advocate Encounter   Received notification that prior authorization for Ubrelvy 100MG  tablets is required/requested.  Per Test Claim: PA required   PA submitted on 04/19/22 to (ins) [employer] via CoverMyMeds Key or (Medicaid) confirmation # [id number] Status is pending

## 2022-04-20 ENCOUNTER — Other Ambulatory Visit: Payer: Self-pay | Admitting: Internal Medicine

## 2022-04-20 DIAGNOSIS — E039 Hypothyroidism, unspecified: Secondary | ICD-10-CM

## 2022-06-22 ENCOUNTER — Telehealth: Payer: Self-pay | Admitting: Pharmacy Technician

## 2022-06-22 NOTE — Telephone Encounter (Signed)
Pharmacy Patient Advocate Encounter   Received notification from CoverMyMeds that prior authorization for Ubrelvy 100MG  tablets is required/requested.    PA submitted to [facility] via PA options: CoverMyMeds Key/confirmation #/EOC [id number] Status is pending

## 2022-06-26 NOTE — Telephone Encounter (Signed)
PA has been APPROVED from 06/22/2022-06/22/2023 

## 2022-06-30 ENCOUNTER — Other Ambulatory Visit (HOSPITAL_COMMUNITY): Payer: Self-pay

## 2022-06-30 MED ORDER — DOXYCYCLINE HYCLATE 100 MG PO TABS
100.0000 mg | ORAL_TABLET | Freq: Two times a day (BID) | ORAL | 0 refills | Status: DC
  Filled 2022-06-30: qty 20, 10d supply, fill #0

## 2022-07-17 ENCOUNTER — Ambulatory Visit: Payer: PRIVATE HEALTH INSURANCE | Admitting: Internal Medicine

## 2022-08-09 ENCOUNTER — Encounter: Payer: Self-pay | Admitting: Internal Medicine

## 2022-08-09 ENCOUNTER — Encounter: Payer: Self-pay | Admitting: Family Medicine

## 2022-08-09 ENCOUNTER — Ambulatory Visit: Payer: PRIVATE HEALTH INSURANCE | Admitting: Internal Medicine

## 2022-08-09 ENCOUNTER — Ambulatory Visit: Payer: PRIVATE HEALTH INSURANCE | Admitting: Family Medicine

## 2022-08-09 VITALS — BP 120/80 | Ht 69.0 in | Wt 168.0 lb

## 2022-08-09 VITALS — BP 128/82 | HR 62 | Temp 97.6°F | Ht 69.0 in | Wt 168.0 lb

## 2022-08-09 DIAGNOSIS — H9313 Tinnitus, bilateral: Secondary | ICD-10-CM | POA: Diagnosis not present

## 2022-08-09 DIAGNOSIS — E039 Hypothyroidism, unspecified: Secondary | ICD-10-CM | POA: Diagnosis not present

## 2022-08-09 DIAGNOSIS — N182 Chronic kidney disease, stage 2 (mild): Secondary | ICD-10-CM

## 2022-08-09 DIAGNOSIS — M722 Plantar fascial fibromatosis: Secondary | ICD-10-CM

## 2022-08-09 DIAGNOSIS — Z114 Encounter for screening for human immunodeficiency virus [HIV]: Secondary | ICD-10-CM

## 2022-08-09 DIAGNOSIS — Z125 Encounter for screening for malignant neoplasm of prostate: Secondary | ICD-10-CM | POA: Diagnosis not present

## 2022-08-09 DIAGNOSIS — Z0001 Encounter for general adult medical examination with abnormal findings: Secondary | ICD-10-CM

## 2022-08-09 DIAGNOSIS — E785 Hyperlipidemia, unspecified: Secondary | ICD-10-CM | POA: Diagnosis not present

## 2022-08-09 DIAGNOSIS — E7841 Elevated Lipoprotein(a): Secondary | ICD-10-CM

## 2022-08-09 DIAGNOSIS — Z1159 Encounter for screening for other viral diseases: Secondary | ICD-10-CM

## 2022-08-09 LAB — URINALYSIS, ROUTINE W REFLEX MICROSCOPIC
Bilirubin Urine: NEGATIVE
Ketones, ur: NEGATIVE
Leukocytes,Ua: NEGATIVE
Nitrite: NEGATIVE
Specific Gravity, Urine: 1.015 (ref 1.000–1.030)
Total Protein, Urine: NEGATIVE
Urine Glucose: NEGATIVE
Urobilinogen, UA: 0.2 (ref 0.0–1.0)
pH: 6.5 (ref 5.0–8.0)

## 2022-08-09 LAB — BASIC METABOLIC PANEL
BUN: 17 mg/dL (ref 6–23)
CO2: 29 mEq/L (ref 19–32)
Calcium: 9.2 mg/dL (ref 8.4–10.5)
Chloride: 104 mEq/L (ref 96–112)
Creatinine, Ser: 1.13 mg/dL (ref 0.40–1.50)
GFR: 68.49 mL/min (ref >60.00)
Glucose, Bld: 91 mg/dL (ref 70–99)
Potassium: 3.6 mEq/L (ref 3.5–5.1)
Sodium: 141 mEq/L (ref 135–145)

## 2022-08-09 LAB — MICROALBUMIN / CREATININE URINE RATIO
Creatinine,U: 85.1 mg/dL
Microalb Creat Ratio: 0.8 mg/g (ref 0.0–30.0)
Microalb, Ur: <0.7 mg/dL (ref 0.0–1.9)

## 2022-08-09 LAB — TSH: TSH: 4.92 u[IU]/mL (ref 0.35–5.50)

## 2022-08-09 LAB — PSA: PSA: 1.68 ng/mL (ref 0.10–4.00)

## 2022-08-09 NOTE — Progress Notes (Signed)
Subjective:  Patient ID: David Mclaughlin, male    DOB: 1955/04/24  Age: 67 y.o. MRN: [id number]  CC: Annual Exam, Hypothyroidism, and Hyperlipidemia   HPI David Mclaughlin presents for a CPX and f/up---  He exercises for 45 minutes a day on a stationary bike.  His endurance is good.  He denies chest pain, shortness of breath, diaphoresis, or edema.  Outpatient Medications Prior to Visit  Medication Sig Dispense Refill   Coenzyme Q10 (CO Q 10) 100 MG CAPS Take 1 capsule by mouth daily.     diazepam (VALIUM) 2 MG tablet Take 1 tablet (2 mg total) by mouth every 6 (six) hours as needed for anxiety 20 tablet 0   fluticasone (FLONASE) 50 MCG/ACT nasal spray Place 1 spray into both nostrils daily.     loratadine (CLARITIN) 10 MG tablet Take 10 mg by mouth daily.     rosuvastatin (CRESTOR) 5 MG tablet Take 1 tablet (5 mg total) by mouth daily. 90 tablet 1   triamterene-hydrochlorothiazide (DYAZIDE) 37.5-25 MG capsule Take 1 capsule by mouth daily. 30 capsule 5   levothyroxine (SYNTHROID) 100 MCG tablet TAKE 1 TABLET BY MOUTH DAILY BEFORE BREAKFAST. 90 tablet 1   doxycycline (VIBRA-TABS) 100 MG tablet Take 1 tablet (100 mg total) by mouth 2 (two) times daily for 10 days. 20 tablet 0   esomeprazole (NEXIUM) 40 MG capsule Take 1 capsule by mouth 2 times daily before meals. 180 capsule 0   Ubrogepant (UBRELVY) 100 MG TABS Take 1 tablet (100 mg total) by mouth daily as needed. 10 tablet 3   Zoster Vaccine Adjuvanted [facility]) injection Inject 0.5 mLs into the muscle. (Patient not taking: Reported on 04/11/2022) 0.5 mL 0   No facility-administered medications prior to visit.    ROS Review of Systems  Constitutional: Negative.  Negative for appetite change, diaphoresis, fatigue and unexpected weight change.  HENT:  Positive for tinnitus. Negative for rhinorrhea and sinus pressure.   Eyes: Negative.   Respiratory:  Negative for cough, chest tightness, shortness of breath and wheezing.    Cardiovascular:  Negative for chest pain, palpitations and leg swelling.  Gastrointestinal:  Negative for abdominal pain, constipation, diarrhea, nausea and vomiting.  Endocrine: Negative.   Genitourinary: Negative.  Negative for difficulty urinating and dysuria.  Musculoskeletal: Negative.  Negative for arthralgias and myalgias.  Skin: Negative.   Neurological:  Positive for dizziness. Negative for weakness and light-headedness.  Hematological:  Negative for adenopathy. Does not bruise/bleed easily.  Psychiatric/Behavioral: Negative.      Objective:  BP 128/82 (BP Location: Left Arm, Patient Position: Sitting, Cuff Size: Large)   Pulse 62   Temp 97.6 F (36.4 C) (Oral)   Ht 5\' 9"  (1.753 m)   Wt 168 lb (76.2 kg)   SpO2 97%   BMI 24.81 kg/m   BP Readings from Last 3 Encounters:  08/09/22 120/80  08/09/22 128/82  04/11/22 128/84    Wt Readings from Last 3 Encounters:  08/09/22 168 lb (76.2 kg)  08/09/22 168 lb (76.2 kg)  04/11/22 169 lb (76.7 kg)    Physical Exam Vitals reviewed.  Constitutional:      Appearance: Normal appearance.  HENT:     Right Ear: Hearing, tympanic membrane and ear canal normal. There is impacted cerumen.     Left Ear: Hearing, tympanic membrane and ear canal normal. There is impacted cerumen.     Mouth/Throat:     Mouth: Mucous membranes are moist.  Eyes:  General: No scleral icterus.    Conjunctiva/sclera: Conjunctivae normal.  Cardiovascular:     Rate and Rhythm: Normal rate and regular rhythm.     Heart sounds: No murmur heard. Pulmonary:     Effort: Pulmonary effort is normal.     Breath sounds: No stridor. No wheezing, rhonchi or rales.  Abdominal:     General: Abdomen is flat.     Palpations: There is no mass.     Tenderness: There is no abdominal tenderness. There is no guarding or rebound.     Hernia: No hernia is present. There is no hernia in the left inguinal area or right inguinal area.  Genitourinary:    Pubic Area: No  rash.      Penis: Normal and uncircumcised.      Testes: Normal.     Epididymis:     Right: Normal.     Left: Normal.     Prostate: Normal. Not enlarged, not tender and no nodules present.     Rectum: Normal. Guaiac result negative. No mass, tenderness, anal fissure, external hemorrhoid or internal hemorrhoid. Normal anal tone.  Musculoskeletal:        General: Normal range of motion.     Cervical back: Neck supple.     Right lower leg: No edema.     Left lower leg: No edema.  Lymphadenopathy:     Cervical: No cervical adenopathy.     Lower Body: No right inguinal adenopathy. No left inguinal adenopathy.  Skin:    General: Skin is warm and dry.  Neurological:     General: No focal deficit present.     Mental Status: He is alert. Mental status is at baseline.  Psychiatric:        Mood and Affect: Mood normal.        Behavior: Behavior normal.     Lab Results  Component Value Date   WBC 6.7 04/11/2022   HGB 14.7 04/11/2022   HCT 42.8 04/11/2022   PLT 240.0 04/11/2022   GLUCOSE 91 08/09/2022   CHOL 148 04/11/2022   TRIG 112.0 04/11/2022   HDL 46.90 04/11/2022   LDLCALC 79 04/11/2022   ALT 15 04/11/2022   AST 19 04/11/2022   NA 141 08/09/2022   K 3.6 08/09/2022   CL 104 08/09/2022   CREATININE 1.13 08/09/2022   BUN 17 08/09/2022   CO2 29 08/09/2022   TSH 4.92 08/09/2022   PSA 1.68 08/09/2022   HGBA1C 5.8 04/12/2021   MICROALBUR <0.7 08/09/2022    CT CARDIAC CALCIUM SCORING (DOCTOR'S DAY ONLY)  Addendum Date: 06/01/2020   ADDENDUM REPORT: 06/01/2020 09:09 EXAM: OVER-READ INTERPRETATION  CT CHEST The following report is an over-read performed by radiologist Dr. Marinda Elk [facility] on 06/01/2020. This over-read does not include interpretation of cardiac or coronary anatomy or pathology. The coronary calcium score interpretation by the cardiologist is attached. COMPARISON:  Prior calcium score study on 03/24/2019 FINDINGS: No significant noncardiac  vascular findings. Visualized mediastinum and hilar regions are unremarkable. Visualized lungs show no evidence of pulmonary edema, consolidation, pneumothorax, nodule or pleural fluid. Visualized upper abdomen and bony structures are unremarkable. IMPRESSION: No significant incidental findings. Electronically Signed   By: Irish Lack M.D.   On: 06/01/2020 09:09   Result Date: 06/01/2020 CLINICAL DATA:  Cardiovascular Disease Risk stratification EXAM: Coronary Calcium Score TECHNIQUE: A gated, non-contrast computed tomography scan of the heart was performed using 3mm slice thickness. Axial images were analyzed on a dedicated workstation.  Calcium scoring of the coronary arteries was performed using the Agatston method. FINDINGS: Coronary arteries: Normal origins. Coronary Calcium Score: Left main: 23.2 Left anterior descending artery: 1.53 Left circumflex artery: 0 Right coronary artery: 25.2 Total: 49.9 Percentile: 52nd Pericardium: Normal. Ascending Aorta: Normal caliber. Non-cardiac: See separate report from [facility]. IMPRESSION: Coronary calcium score of 49.9. This was 62nd percentile for age-, race-, and sex-matched controls. RECOMMENDATIONS: Coronary artery calcium (CAC) score is a strong predictor of incident coronary heart disease (CHD) and provides predictive information beyond traditional risk factors. CAC scoring is reasonable to use in the decision to withhold, postpone, or initiate statin therapy in intermediate-risk or selected borderline-risk asymptomatic adults (age 35-75 years and LDL-C >=70 to <190 mg/dL) who do not have diabetes or established atherosclerotic cardiovascular disease (ASCVD).* In intermediate-risk (10-year ASCVD risk >=7.5% to <20%) adults or selected borderline-risk (10-year ASCVD risk >=5% to <7.5%) adults in whom a CAC score is measured for the purpose of making a treatment decision the following recommendations have been made: If CAC=0, it is reasonable to  withhold statin therapy and reassess in 5 to 10 years, as long as higher risk conditions are absent (diabetes mellitus, family history of premature CHD in first degree relatives (males <55 years; females <65 years), cigarette smoking, or LDL >=190 mg/dL). If CAC is 1 to 99, it is reasonable to initiate statin therapy for patients >=27 years of age. If CAC is >=100 or >=75th percentile, it is reasonable to initiate statin therapy at any age. Cardiology referral should be considered for patients with CAC scores >=400 or >=75th percentile. *2018 AHA/ACC/AACVPR/AAPA/ABC/ACPM/ADA/AGS/APhA/ASPC/NLA/PCNA Guideline on the Management of Blood Cholesterol: A Report of the American College of Cardiology/American Heart Association Task Force on Clinical Practice Guidelines. J Am Coll Cardiol. 2019;73(24):3168-3209. Armanda Magic, MD Electronically Signed: By: Armanda Magic On: 05/31/2020 17:51    Assessment & Plan:  Encounter for screening for HIV -     HIV Antibody (routine testing w rflx); Future  Need for hepatitis C screening test -     Hepatitis C antibody; Future  CKD (chronic kidney disease) stage 2, GFR 60-89 ml/min- His renal function has improved. -     Basic metabolic panel; Future -     Microalbumin / creatinine urine ratio; Future -     Urinalysis, Routine w reflex microscopic; Future -     Cystatin C with Glomerular Filtration Rate, Estimated (eGFR); Future  Acquired hypothyroidism- He is euthyroid. -     TSH; Future  Encounter for general adult medical examination with abnormal findings- Exam completed, labs reviewed, vaccines reviewed and updated, cancer screenings addressed, pt ed material was given.  -     PSA; Future  Dyslipidemia, goal LDL below 100 - LDL goal achieved. Doing well on the statin  -     Lipoprotein A (LPA); Future  Tinnitus aurium, bilateral -     Ambulatory referral to ENT  High serum lipoprotein(a) -     Aspirin; Take 1 tablet (81 mg total) by mouth daily. Swallow  whole.  Dispense: 90 tablet; Refill: 1     Follow-up: Return in about 6 months (around 02/09/2023).  Sanda Linger, MD

## 2022-08-09 NOTE — Patient Instructions (Addendum)
You have plantar fasciitis Take tylenol and/or aleve as needed for pain Plantar fascia stretch for 20-30 seconds (do 3 of these) in morning Lowering/raise on a step exercises 3 x 10 once or twice a day - this is very important for long term recovery. Ice heel for 15 minutes as needed. Avoid flat shoes/barefoot walking as much as possible. Inserts are important (dr. Zoe Lan active series, spencos, our green insoles, custom orthotics). [place] sock when sleeping to help keep this stretched out. Start shockwave therapy (first treatment done today) - follow up in 1 week for second of 4 treatments. Physical therapy is also an option.

## 2022-08-09 NOTE — Patient Instructions (Signed)
Health Maintenance, Male Adopting a healthy lifestyle and getting preventive care are important in promoting health and wellness. Ask your health care provider about: The right schedule for you to have regular tests and exams. Things you can do on your own to prevent diseases and keep yourself healthy. What should I know about diet, weight, and exercise? Eat a healthy diet  Eat a diet that includes plenty of vegetables, fruits, low-fat dairy products, and lean protein. Do not eat a lot of foods that are high in solid fats, added sugars, or sodium. Maintain a healthy weight Body mass index (BMI) is a measurement that can be used to identify possible weight problems. It estimates body fat based on height and weight. Your health care provider can help determine your BMI and help you achieve or maintain a healthy weight. Get regular exercise Get regular exercise. This is one of the most important things you can do for your health. Most adults should: Exercise for at least 150 minutes each week. The exercise should increase your heart rate and make you sweat (moderate-intensity exercise). Do strengthening exercises at least twice a week. This is in addition to the moderate-intensity exercise. Spend less time sitting. Even light physical activity can be beneficial. Watch cholesterol and blood lipids Have your blood tested for lipids and cholesterol at 65 years of age, then have this test every 5 years. You may need to have your cholesterol levels checked more often if: Your lipid or cholesterol levels are high. You are older than 65 years of age. You are at high risk for heart disease. What should I know about cancer screening? Many types of cancers can be detected early and may often be prevented. Depending on your health history and family history, you may need to have cancer screening at various ages. This may include screening for: Colorectal cancer. Prostate cancer. Skin cancer. Lung  cancer. What should I know about heart disease, diabetes, and high blood pressure? Blood pressure and heart disease High blood pressure causes heart disease and increases the risk of stroke. This is more likely to develop in people who have high blood pressure readings or are overweight. Talk with your health care provider about your target blood pressure readings. Have your blood pressure checked: Every 3-5 years if you are 18-39 years of age. Every year if you are 40 years old or older. If you are between the ages of 65 and 75 and are a current or former smoker, ask your health care provider if you should have a one-time screening for abdominal aortic aneurysm (AAA). Diabetes Have regular diabetes screenings. This checks your fasting blood sugar level. Have the screening done: Once every three years after age 45 if you are at a normal weight and have a low risk for diabetes. More often and at a younger age if you are overweight or have a high risk for diabetes. What should I know about preventing infection? Hepatitis B If you have a higher risk for hepatitis B, you should be screened for this virus. Talk with your health care provider to find out if you are at risk for hepatitis B infection. Hepatitis C Blood testing is recommended for: Everyone born from 1945 through 1965. Anyone with known risk factors for hepatitis C. Sexually transmitted infections (STIs) You should be screened each year for STIs, including gonorrhea and chlamydia, if: You are sexually active and are younger than 65 years of age. You are older than 65 years of age and your   health care provider tells you that you are at risk for this type of infection. Your sexual activity has changed since you were last screened, and you are at increased risk for chlamydia or gonorrhea. Ask your health care provider if you are at risk. Ask your health care provider about whether you are at high risk for HIV. Your health care provider  may recommend a prescription medicine to help prevent HIV infection. If you choose to take medicine to prevent HIV, you should first get tested for HIV. You should then be tested every 3 months for as long as you are taking the medicine. Follow these instructions at home: Alcohol use Do not drink alcohol if your health care provider tells you not to drink. If you drink alcohol: Limit how much you have to 0-2 drinks a day. Know how much alcohol is in your drink. In the [place] one drink equals one 12 oz bottle of beer (355 mL), one 5 oz glass of wine (148 mL), or one 1 oz glass of hard liquor (44 mL). Lifestyle Do not use any products that contain nicotine or tobacco. These products include cigarettes, chewing tobacco, and vaping devices, such as e-cigarettes. If you need help quitting, ask your health care provider. Do not use street drugs. Do not share needles. Ask your health care provider for help if you need support or information about quitting drugs. General instructions Schedule regular health, dental, and eye exams. Stay current with your vaccines. Tell your health care provider if: You often feel depressed. You have ever been abused or do not feel safe at home. Summary Adopting a healthy lifestyle and getting preventive care are important in promoting health and wellness. Follow your health care provider's instructions about healthy diet, exercising, and getting tested or screened for diseases. Follow your health care provider's instructions on monitoring your cholesterol and blood pressure. This information is not intended to replace advice given to you by your health care provider. Make sure you discuss any questions you have with your health care provider. Document Revised: 05/17/2020 Document Reviewed: 05/17/2020 Elsevier Patient Education  2024 Elsevier Inc.  

## 2022-08-09 NOTE — Progress Notes (Signed)
PCP: Etta Grandchild, MD  Subjective:   HPI: Patient is a 67 y.o. male here for continued left foot pain.  He was last evaluated on 02/15/2022 found to have plantar fasciitis and started ECSW therapy.  He received 1 of 4 anticipated rounds of shockwave therapy.  He reports that he got 2 to 3 weeks of relief though had resumption in the same pain he had prior.  Pain has been ongoing for now 13 months.  He locates it to the plantar aspect of the calcaneus as well as the medial aspect of the calcaneus.  He was previously active person but hiking and backpacking has been restricted in activity now 13 months.  Minimal relief with topical Voltaren.  He has continued to intermittently do his home stretches with mild improvement.  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  He returns today for continuation of ECSW therapy.  Past Medical History:  Diagnosis Date   BPPV (benign paroxysmal positional vertigo)    Cavernous angioma 1996   COVID-19 06/2020   Dizziness    Gallstones 2016   GERD (gastroesophageal reflux disease)    Sullivan Lone syndrome    Hx of adenomatous polyp of colon 03/2020   Hypercholesteremia    Hypothyroidism    IBS (irritable bowel syndrome)    Palpitations    Pneumonia 07/2020   After COVID   PVC's (premature ventricular contractions)    positional   Shulman's syndrome    eosinophilic fasciitis    Current Outpatient Medications on File Prior to Visit  Medication Sig Dispense Refill   Coenzyme Q10 (CO Q 10) 100 MG CAPS Take 1 capsule by mouth daily.     diazepam (VALIUM) 2 MG tablet Take 1 tablet (2 mg total) by mouth every 6 (six) hours as needed for anxiety 20 tablet 0   fluticasone (FLONASE) 50 MCG/ACT nasal spray Place 1 spray into both nostrils daily.     levothyroxine (SYNTHROID) 100 MCG tablet TAKE 1 TABLET BY MOUTH DAILY BEFORE BREAKFAST. 90 tablet 1   loratadine (CLARITIN) 10 MG tablet Take 10 mg by  mouth daily.     rosuvastatin (CRESTOR) 5 MG tablet Take 1 tablet (5 mg total) by mouth daily. 90 tablet 1   triamterene-hydrochlorothiazide (DYAZIDE) 37.5-25 MG capsule Take 1 capsule by mouth daily. 30 capsule 5   No current facility-administered medications on file prior to visit.    Past Surgical History:  Procedure Laterality Date   BACK SURGERY  1996 & 1999   CHOLECYSTECTOMY     COLONOSCOPY      Allergies  Allergen Reactions   Maxalt [Rizatriptan] Other (See Comments)    sedation    There were no vitals taken for this visit.      No data to display              No data to display              Objective:  Physical Exam:  Gen: NAD, comfortable in exam room Pulm: Breathing comfortably, symmetric chest rise  MSK left foot: No deformity, swelling, erythema or ecchymosis of foot/ankle. Full ROM. Nontender over medial or lateral malleolus. Tender to palpation over plantar aspect of the calcaneus though not significantly tender throughout plantar fascia distribution. Mildly positive calcaneal squeeze. Negative Thompson test. Mild TTP at insertion site of left achilles tendon. Nontender tendinous-muscular junction of achilles. Tight hamstrings b/l  Assessment & Plan:  1. Plantar fasciitis of left foot Continuation of plantar fasciitis patient would like to pursue resumption of ECSW therapy.  He has failed other conservative measures including stretching, icing, heat, Voltaren gel.  He does have green orthotics and is tolerating these well.  Follow-up in 1 week for 2nd round of 4 total ECSW treatments.  Mild component of chronic achilles tendinopathy as well with hx of multiple prior achilles tears. Can consider ECSWT for this issue in the future. Askling hamstring stretches provided today given complaint of tight hamstrings.  Procedure: ECSWT Indications:  Plantar Fasciitis   Procedure Details Consent: Risks of procedure as well as the alternatives and  risks of each were explained to the patient.  Written consent for procedure obtained. Time Out: Verified patient identification, verified procedure, site was marked, verified correct patient position, medications/allergies/relevent history reviewed.     The left foot was targeted for Extracorporeal shockwave therapy.    Preset: Plantar Fasciitis Power Level: 90 mJ Frequency: 15 Hz Impulse/cycles: 2500 Head size: Large   Patient tolerated procedure well without immediate complications   Glean Salen, MD PGY-4, Sports Medicine Fellow [facility]

## 2022-08-13 MED ORDER — LEVOTHYROXINE SODIUM 100 MCG PO TABS
100.0000 ug | ORAL_TABLET | Freq: Every day | ORAL | 1 refills | Status: DC
Start: 2022-08-13 — End: 2023-02-12

## 2022-08-13 MED ORDER — ASPIRIN 81 MG PO TBEC
81.0000 mg | DELAYED_RELEASE_TABLET | Freq: Every day | ORAL | 1 refills | Status: AC
Start: 2022-08-13 — End: ?

## 2022-08-13 MED ORDER — LEVOTHYROXINE SODIUM 100 MCG PO TABS
100.0000 ug | ORAL_TABLET | Freq: Every day | ORAL | 1 refills | Status: DC
Start: 2022-08-13 — End: 2022-08-13

## 2022-08-16 ENCOUNTER — Ambulatory Visit: Payer: Self-pay | Admitting: Family Medicine

## 2022-08-18 ENCOUNTER — Encounter: Payer: Self-pay | Admitting: Family Medicine

## 2022-08-18 ENCOUNTER — Ambulatory Visit (INDEPENDENT_AMBULATORY_CARE_PROVIDER_SITE_OTHER): Payer: PRIVATE HEALTH INSURANCE | Admitting: Family Medicine

## 2022-08-18 DIAGNOSIS — M722 Plantar fascial fibromatosis: Secondary | ICD-10-CM

## 2022-08-18 NOTE — Progress Notes (Signed)
PCP: Etta Grandchild, MD  Subjective:   HPI: Patient is a 67 y.o. male here for shockwave therapy.  Patient here for second shockwave treatment for left plantar fasciitis. Overall maybe a little improvement but not like the treatment he had months ago.  Past Medical History:  Diagnosis Date   BPPV (benign paroxysmal positional vertigo)    Cavernous angioma 1996   COVID-19 06/2020   Dizziness    Gallstones 2016   GERD (gastroesophageal reflux disease)    Sullivan Lone syndrome    Hx of adenomatous polyp of colon 03/2020   Hypercholesteremia    Hypothyroidism    IBS (irritable bowel syndrome)    Palpitations    Pneumonia 07/2020   After COVID   PVC's (premature ventricular contractions)    positional   Shulman's syndrome    eosinophilic fasciitis    Current Outpatient Medications on File Prior to Visit  Medication Sig Dispense Refill   aspirin EC 81 MG tablet Take 1 tablet (81 mg total) by mouth daily. Swallow whole. 90 tablet 1   Coenzyme Q10 (CO Q 10) 100 MG CAPS Take 1 capsule by mouth daily.     diazepam (VALIUM) 2 MG tablet Take 1 tablet (2 mg total) by mouth every 6 (six) hours as needed for anxiety 20 tablet 0   fluticasone (FLONASE) 50 MCG/ACT nasal spray Place 1 spray into both nostrils daily.     levothyroxine (SYNTHROID) 100 MCG tablet Take 1 tablet (100 mcg total) by mouth daily before breakfast. 90 tablet 1   loratadine (CLARITIN) 10 MG tablet Take 10 mg by mouth daily.     rosuvastatin (CRESTOR) 5 MG tablet Take 1 tablet (5 mg total) by mouth daily. 90 tablet 1   triamterene-hydrochlorothiazide (DYAZIDE) 37.5-25 MG capsule Take 1 capsule by mouth daily. 30 capsule 5   No current facility-administered medications on file prior to visit.    Past Surgical History:  Procedure Laterality Date   BACK SURGERY  1996 & 1999   CHOLECYSTECTOMY     COLONOSCOPY      Allergies  Allergen Reactions   Maxalt [Rizatriptan] Other (See Comments)    sedation    There were  no vitals taken for this visit.      No data to display              No data to display              Objective:  Physical Exam:  Gen: NAD, comfortable in exam room   Assessment & Plan:  1. Left plantar fasciitis - second shockwave treatment performed today.  F/u in 1 week for third treatment.  Procedure: ECSWT Indications:  left plantar fasciitis   Procedure Details Consent: Risks of procedure as well as the alternatives and risks of each were explained to the patient.  Written consent for procedure obtained. Time Out: Verified patient identification, verified procedure, site was marked, verified correct patient position, medications/allergies/relevent history reviewed.  The area was cleaned with alcohol swab.     The left plantar fasci was targeted for Extracorporeal shockwave therapy.    Preset: plantar fasciitis Power Level: 90 Frequency: 15 Impulse/cycles: 2500 Head size: large   Patient tolerated procedure well without immediate complications

## 2022-10-13 NOTE — Progress Notes (Signed)
Cardiology Office Note:    Date:  10/20/2022   ID:  David Mclaughlin, DOB [birth year], MRN [id number]  PCP:  Etta Grandchild, MD   [facility] Providers Cardiologist:  None     Referring MD: Etta Grandchild, MD   Chief Complaint  Patient presents with   New Patient (Initial Visit)    Reestablish care.   Coronary Artery Disease    History of Present Illness:    David Mclaughlin is a 67 y.o. male seen to establish cardiac care. Was seen in the past by Dr Elease Hashimoto - last seen in 2020. Had palpitations at that time. Normal Myoview in 2004. Stress Echo in 2010 was normal. Noted mild MV prolapse and mild MR. Coronary calcium score in May 2022 was 50.   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 Planning to retire in one month. Wanted general cardiac check up. He denies any chest pain, dyspnea or palpitations. Has some intermittent dizziness related to Meniere's disease. Uses Dyazide PRN. Does have family history of MI   Past Medical History:  Diagnosis Date   BPPV (benign paroxysmal positional vertigo)    Cavernous angioma 1996   COVID-19 06/2020   Dizziness    Gallstones 2016   GERD (gastroesophageal reflux disease)    Sullivan Lone syndrome    Hx of adenomatous polyp of colon 03/2020   Hypercholesteremia    Hypothyroidism    IBS (irritable bowel syndrome)    Palpitations    Pneumonia 07/2020   After COVID   PVC's (premature ventricular contractions)    positional   Shulman's syndrome    eosinophilic fasciitis    Past Surgical History:  Procedure Laterality Date   BACK SURGERY  1996 & 1999   CHOLECYSTECTOMY     COLONOSCOPY      Current Medications: Current Meds  Medication Sig   aspirin EC 81 MG tablet Take 1 tablet (81 mg total) by mouth daily. Swallow whole. (Patient taking differently: Take 81 mg by mouth 3 (three) times a week. Swallow whole.)   Coenzyme Q10 (CO Q 10) 100 MG CAPS Take 1 capsule by mouth daily.   diazepam (VALIUM) 2 MG tablet  Take 1 tablet (2 mg total) by mouth every 6 (six) hours as needed for anxiety   fluticasone (FLONASE) 50 MCG/ACT nasal spray Place 1 spray into both nostrils daily.   levothyroxine (SYNTHROID) 100 MCG tablet Take 1 tablet (100 mcg total) by mouth daily before breakfast.   loratadine (CLARITIN) 10 MG tablet Take 10 mg by mouth daily.   rosuvastatin (CRESTOR) 10 MG tablet Take 1 tablet (10 mg total) by mouth daily.   triamterene-hydrochlorothiazide (DYAZIDE) 37.5-25 MG capsule Take 1 capsule by mouth daily. (Patient taking differently: Take 1 capsule by mouth as needed.)   [DISCONTINUED] rosuvastatin (CRESTOR) 5 MG tablet Take 1 tablet (5 mg total) by mouth daily.     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 [employer]   Occasional alcohol, never smoker, no drug use   Social Determinants of Health   Financial Resource Strain: Low Risk  (08/08/2022)   Overall Financial Resource Strain (CARDIA)    Difficulty of Paying Living Expenses: Not hard at all  Food Insecurity: No Food Insecurity (08/08/2022)   Hunger Vital Sign    Worried About Running Out of Food in the Last Year: Never true    Ran Out of Food in the Last Year: Never true  Transportation Needs: No Transportation Needs (08/08/2022)   PRAPARE - Administrator, Civil Service (Medical): No    Lack of Transportation (Non-Medical):  No  Physical Activity: Insufficiently Active (08/08/2022)   Exercise Vital Sign    Days of Exercise per Week: 3 days    Minutes of Exercise per Session: 30 min  Stress: Stress Concern Present (08/08/2022)   [employer] of Occupational Health - Occupational Stress Questionnaire    Feeling of Stress : To some extent  Social Connections: Moderately Integrated (08/08/2022)   Social Connection and Isolation Panel [NHANES]    Frequency of Communication with Friends and Family: More than three times a week    Frequency of Social Gatherings with Friends and Family: Once a week    Attends Religious Services: Never    Database administrator or Organizations: Yes    Attends Engineer, structural: More than 4 times per year    Marital Status: Married     Family History: The patient's family history includes Bladder Cancer in his brother; Colon polyps in his sister; Heart attack in an other family member; Heart disease in an other family member; Hypertension in his mother and another family member; Parkinson's disease in his father. There is no history of Esophageal cancer, Stomach cancer, or Rectal cancer.  ROS:   Please see the history of present illness.     All other systems reviewed and are negative.  EKGs/Labs/Other Studies Reviewed:    The following studies were reviewed today: EKG Interpretation Date/Time:  Friday October 20 2022 08:36:22 EDT Ventricular Rate:  66 PR Interval:  136 QRS Duration:  92 QT Interval:  388 QTC Calculation: 406 R Axis:   68  Text Interpretation: Normal sinus rhythm Normal ECG When compared with ECG of 04-Nov-1998 15:10, No significant change was found Confirmed by [place], Roann Merk [phone]) on 10/20/2022 8:39:33 AM   EKG Interpretation Date/Time:  Friday October 20 2022 08:36:22 EDT Ventricular Rate:  66 PR Interval:  136 QRS Duration:  92 QT Interval:  388 QTC Calculation: 406 R Axis:   68  Text Interpretation: Normal sinus rhythm Normal  ECG When compared with ECG of 04-Nov-1998 15:10, No significant change was found Confirmed by [place], Kyriakos Babler [phone]) on 10/20/2022 8:39:33 AM    Recent Labs: 04/11/2022: ALT 15; Hemoglobin 14.7; Platelets 240.0 08/09/2022: BUN 17; Creatinine, Ser 1.13; Potassium 3.6; Sodium 141; TSH 4.92  Recent Lipid Panel    Component Value Date/Time   CHOL 148 04/11/2022 1558   TRIG 112.0 04/11/2022 1558   HDL 46.90 04/11/2022 1558   CHOLHDL 3 04/11/2022 1558   VLDL 22.4 04/11/2022 1558   LDLCALC 79 04/11/2022 1558     Risk Assessment/Calculations:  Physical Exam:    VS:  BP 128/82 (BP Location: Left Arm, Patient Position: Sitting, Cuff Size: Normal)   Pulse 66   Ht 5\' 9"  (1.753 m)   Wt 168 lb (76.2 kg)   BMI 24.81 kg/m     Wt Readings from Last 3 Encounters:  10/20/22 168 lb (76.2 kg)  08/09/22 168 lb (76.2 kg)  08/09/22 168 lb (76.2 kg)     GEN:  Well nourished, well developed in no acute distress HEENT: Normal NECK: No JVD; No carotid bruits LYMPHATICS: No lymphadenopathy CARDIAC: RRR, no murmurs, rubs, gallops RESPIRATORY:  Clear to auscultation without rales, wheezing or rhonchi  ABDOMEN: Soft, non-tender, non-distended MUSCULOSKELETAL:  No edema; No deformity  SKIN: Warm and dry NEUROLOGIC:  Alert and oriented x 3 PSYCHIATRIC:  Normal affect   ASSESSMENT:    1. Coronary artery calcification   2. PVC (premature ventricular contraction)    PLAN:    In order of problems listed above:  Coronary artery calcification. No symptoms of angina. Focus on risk factor modification. Taking ASA. Would like to see LDL < 55. Patient to inform use is he notes any new chest pain or dyspnea HLD. Recommend increasing Crestor to 10 mg daily. Repeat lab in 3 months. Heart healthy diet and regular aerobic exercise. He does have elevated lipoprotein (a) at 229. Will await trial data on newer injectables History of mild MV prolapse. No murmur or click on exam             Medication Adjustments/Labs and Tests Ordered: Current medicines are reviewed at length with the patient today.  Concerns regarding medicines are outlined above.  Orders Placed This Encounter  Procedures   Lipid panel   Basic Metabolic Panel (BMET)   Hepatic function panel   EKG 12-Lead   Meds ordered this encounter  Medications   rosuvastatin (CRESTOR) 10 MG tablet    Sig: Take 1 tablet (10 mg total) by mouth daily.    Dispense:  90 tablet    Refill:  3    Patient Instructions  Medication Instructions:  Crestor 10mg  *If you need a refill on your cardiac medications before your next appointment, please call your pharmacy*   Lab Work: Lipid panel Bmet Hepatic function If you have labs (blood work) drawn today and your tests are completely normal, you will receive your results only by: MyChart Message (if you have MyChart) OR A paper copy in the mail If you have any lab test that is abnormal or we need to change your treatment, we will call you to review the results.   Testing/Procedures: None   Follow-Up: At [facility], you and your health needs are our priority.  As part of our continuing mission to provide you with exceptional heart care, we have created designated Provider Care Teams.  These Care Teams include your primary Cardiologist (physician) and Advanced Practice Providers (APPs -  Physician Assistants and Nurse Practitioners) who all work together to provide you with the care you need, when you need it.  We recommend signing up for the patient portal called "MyChart".  Sign up information is provided on this After Visit Summary.  MyChart is used to connect with patients for Virtual Visits (Telemedicine).  Patients are able to view lab/test results, encounter notes, upcoming appointments, etc.  Non-urgent messages can be sent to your provider as well.   To learn more about what you can do with MyChart, go to [email or web address].    Your next  appointment:   Follow up in one year call in June for October appointment  Provider:   Dr.Leyna Vanderkolk     Signed, Durant Scibilia [place], MD  10/20/2022 8:56 AM    [facility]

## 2022-10-20 ENCOUNTER — Ambulatory Visit: Payer: PRIVATE HEALTH INSURANCE | Attending: Cardiology | Admitting: Cardiology

## 2022-10-20 ENCOUNTER — Other Ambulatory Visit (HOSPITAL_COMMUNITY): Payer: Self-pay

## 2022-10-20 ENCOUNTER — Encounter: Payer: Self-pay | Admitting: Cardiology

## 2022-10-20 VITALS — BP 128/82 | HR 66 | Ht 69.0 in | Wt 168.0 lb

## 2022-10-20 DIAGNOSIS — I493 Ventricular premature depolarization: Secondary | ICD-10-CM

## 2022-10-20 DIAGNOSIS — I2510 Atherosclerotic heart disease of native coronary artery without angina pectoris: Secondary | ICD-10-CM

## 2022-10-20 MED ORDER — ROSUVASTATIN CALCIUM 10 MG PO TABS
10.0000 mg | ORAL_TABLET | Freq: Every day | ORAL | 3 refills | Status: DC
  Filled 2022-10-20: qty 30, 30d supply, fill #0
  Filled 2022-12-15: qty 30, 30d supply, fill #1
  Filled 2023-01-16: qty 30, 30d supply, fill #2
  Filled 2023-03-02: qty 30, 30d supply, fill #3
  Filled 2023-04-03: qty 90, 90d supply, fill #4
  Filled 2023-07-03: qty 90, 90d supply, fill #5
  Filled 2023-10-01: qty 60, 60d supply, fill #6

## 2022-10-20 NOTE — Patient Instructions (Signed)
Medication Instructions:  Crestor 10mg  *If you need a refill on your cardiac medications before your next appointment, please call your pharmacy*   Lab Work: Lipid panel Bmet Hepatic function If you have labs (blood work) drawn today and your tests are completely normal, you will receive your results only by: MyChart Message (if you have MyChart) OR A paper copy in the mail If you have any lab test that is abnormal or we need to change your treatment, we will call you to review the results.   Testing/Procedures: None   Follow-Up: At [facility], you and your health needs are our priority.  As part of our continuing mission to provide you with exceptional heart care, we have created designated Provider Care Teams.  These Care Teams include your primary Cardiologist (physician) and Advanced Practice Providers (APPs -  Physician Assistants and Nurse Practitioners) who all work together to provide you with the care you need, when you need it.  We recommend signing up for the patient portal called "MyChart".  Sign up information is provided on this After Visit Summary.  MyChart is used to connect with patients for Virtual Visits (Telemedicine).  Patients are able to view lab/test results, encounter notes, upcoming appointments, etc.  Non-urgent messages can be sent to your provider as well.   To learn more about what you can do with MyChart, go to [email or web address].    Your next appointment:   Follow up in one year call in June for October appointment  Provider:   Dr.Jordan

## 2022-12-13 ENCOUNTER — Other Ambulatory Visit (HOSPITAL_COMMUNITY): Payer: Self-pay

## 2022-12-14 ENCOUNTER — Other Ambulatory Visit (HOSPITAL_COMMUNITY): Payer: Self-pay

## 2022-12-15 ENCOUNTER — Other Ambulatory Visit (HOSPITAL_COMMUNITY): Payer: Self-pay

## 2022-12-15 MED ORDER — ROSUVASTATIN CALCIUM 5 MG PO TABS
5.0000 mg | ORAL_TABLET | Freq: Every day | ORAL | 3 refills | Status: DC
  Filled 2022-12-15: qty 30, 30d supply, fill #0

## 2022-12-15 MED ORDER — LEVOTHYROXINE SODIUM 100 MCG PO TABS
100.0000 ug | ORAL_TABLET | Freq: Every day | ORAL | 1 refills | Status: DC
  Filled 2022-12-15: qty 30, 30d supply, fill #0
  Filled 2023-01-16: qty 30, 30d supply, fill #1

## 2022-12-18 ENCOUNTER — Other Ambulatory Visit (HOSPITAL_COMMUNITY): Payer: Self-pay

## 2022-12-27 ENCOUNTER — Other Ambulatory Visit (HOSPITAL_COMMUNITY): Payer: Self-pay

## 2022-12-27 MED ORDER — TRIAMTERENE-HCTZ 37.5-25 MG PO CAPS
1.0000 | ORAL_CAPSULE | Freq: Every day | ORAL | 5 refills | Status: DC
Start: 2022-12-27 — End: 2023-10-30
  Filled 2022-12-27: qty 30, 30d supply, fill #0

## 2022-12-28 DIAGNOSIS — H8101 Meniere's disease, right ear: Secondary | ICD-10-CM | POA: Insufficient documentation

## 2022-12-29 ENCOUNTER — Other Ambulatory Visit (HOSPITAL_COMMUNITY): Payer: Self-pay

## 2023-01-01 ENCOUNTER — Other Ambulatory Visit (HOSPITAL_COMMUNITY): Payer: Self-pay

## 2023-01-16 ENCOUNTER — Other Ambulatory Visit: Payer: Self-pay

## 2023-01-16 ENCOUNTER — Other Ambulatory Visit (HOSPITAL_COMMUNITY): Payer: Self-pay

## 2023-01-25 DIAGNOSIS — H8111 Benign paroxysmal vertigo, right ear: Secondary | ICD-10-CM | POA: Diagnosis not present

## 2023-02-12 ENCOUNTER — Other Ambulatory Visit (HOSPITAL_COMMUNITY): Payer: Self-pay

## 2023-02-12 ENCOUNTER — Ambulatory Visit: Payer: Commercial Managed Care - PPO | Admitting: Internal Medicine

## 2023-02-12 ENCOUNTER — Encounter: Payer: Self-pay | Admitting: Internal Medicine

## 2023-02-12 VITALS — BP 118/82 | HR 66 | Ht 69.0 in | Wt 161.0 lb

## 2023-02-12 DIAGNOSIS — E785 Hyperlipidemia, unspecified: Secondary | ICD-10-CM | POA: Diagnosis not present

## 2023-02-12 DIAGNOSIS — E039 Hypothyroidism, unspecified: Secondary | ICD-10-CM | POA: Diagnosis not present

## 2023-02-12 DIAGNOSIS — N182 Chronic kidney disease, stage 2 (mild): Secondary | ICD-10-CM | POA: Diagnosis not present

## 2023-02-12 LAB — CBC WITH DIFFERENTIAL/PLATELET
Basophils Absolute: 0.1 10*3/uL (ref 0.0–0.1)
Basophils Relative: 1.9 % (ref 0.0–3.0)
Eosinophils Absolute: 0.2 10*3/uL (ref 0.0–0.7)
Eosinophils Relative: 3.6 % (ref 0.0–5.0)
HCT: 42.5 % (ref 39.0–52.0)
Hemoglobin: 14.4 g/dL (ref 13.0–17.0)
Lymphocytes Relative: 28 % (ref 12.0–46.0)
Lymphs Abs: 1.5 10*3/uL (ref 0.7–4.0)
MCHC: 34 g/dL (ref 30.0–36.0)
MCV: 88.8 fL (ref 78.0–100.0)
Monocytes Absolute: 0.5 10*3/uL (ref 0.1–1.0)
Monocytes Relative: 8.6 % (ref 3.0–12.0)
Neutro Abs: 3.1 10*3/uL (ref 1.4–7.7)
Neutrophils Relative %: 57.9 % (ref 43.0–77.0)
Platelets: 208 10*3/uL (ref 150.0–400.0)
RBC: 4.78 Mil/uL (ref 4.22–5.81)
RDW: 13.6 % (ref 11.5–15.5)
WBC: 5.3 10*3/uL (ref 4.0–10.5)

## 2023-02-12 LAB — HEPATIC FUNCTION PANEL
ALT: 12 U/L (ref 0–53)
AST: 18 U/L (ref 0–37)
Albumin: 4 g/dL (ref 3.5–5.2)
Alkaline Phosphatase: 59 U/L (ref 39–117)
Bilirubin, Direct: 0.3 mg/dL (ref 0.0–0.3)
Total Bilirubin: 1.8 mg/dL — ABNORMAL HIGH (ref 0.2–1.2)
Total Protein: 7.1 g/dL (ref 6.0–8.3)

## 2023-02-12 LAB — URINALYSIS, ROUTINE W REFLEX MICROSCOPIC
Bilirubin Urine: NEGATIVE
Ketones, ur: NEGATIVE
Leukocytes,Ua: NEGATIVE
Nitrite: NEGATIVE
Specific Gravity, Urine: 1.02 (ref 1.000–1.030)
Total Protein, Urine: NEGATIVE
Urine Glucose: NEGATIVE
Urobilinogen, UA: 0.2 (ref 0.0–1.0)
pH: 6 (ref 5.0–8.0)

## 2023-02-12 LAB — BASIC METABOLIC PANEL
BUN: 18 mg/dL (ref 6–23)
CO2: 28 meq/L (ref 19–32)
Calcium: 9 mg/dL (ref 8.4–10.5)
Chloride: 105 meq/L (ref 96–112)
Creatinine, Ser: 1.21 mg/dL (ref 0.40–1.50)
GFR: 62.86 mL/min (ref >60.00)
Glucose, Bld: 94 mg/dL (ref 70–99)
Potassium: 3.9 meq/L (ref 3.5–5.1)
Sodium: 140 meq/L (ref 135–145)

## 2023-02-12 LAB — LIPID PANEL
Cholesterol: 115 mg/dL (ref 0–200)
HDL: 48.4 mg/dL (ref >39.00)
LDL Cholesterol: 49 mg/dL (ref 0–99)
NonHDL: 66.12
Total CHOL/HDL Ratio: 2
Triglycerides: 86 mg/dL (ref 0.0–149.0)
VLDL: 17.2 mg/dL (ref 0.0–40.0)

## 2023-02-12 LAB — CK: Total CK: 77 U/L (ref 7–232)

## 2023-02-12 LAB — TSH: TSH: 3.29 u[IU]/mL (ref 0.35–5.50)

## 2023-02-12 MED ORDER — LEVOTHYROXINE SODIUM 100 MCG PO TABS
100.0000 ug | ORAL_TABLET | Freq: Every day | ORAL | 1 refills | Status: DC
  Filled 2023-02-12: qty 90, 90d supply, fill #0
  Filled 2023-05-16: qty 90, 90d supply, fill #1

## 2023-02-12 NOTE — Patient Instructions (Signed)

## 2023-02-12 NOTE — Progress Notes (Signed)
Subjective:  Patient ID: David Mclaughlin, male    DOB: 29-Apr-1955  Age: 68 y.o. MRN: [id number]  CC: Hypothyroidism and Hyperlipidemia   HPI David Mclaughlin presents for f/up ----  Discussed the use of AI scribe software for clinical note transcription with the patient, who gave verbal consent to proceed.  History of Present Illness   The patient is a 68 year old who presents for a routine follow-up and lab work.  The patient is doing well and has retired since his last visit. He is here for routine follow-up and to have his lipid profile checked as requested by his cardiologist. He has received the shingles, COVID, and flu vaccines but has opted to delay the pneumonia vaccine. He remains active, hiking daily without any endurance issues.  He is currently taking Crestor 10 mg daily for hyperlipidemia and has not experienced any muscle or joint aches. He also takes CoQ10 daily. No dizziness or lightheadedness reported.  He experiences occasional bouts of vertigo and is under the care of Dr. Jenne Pane for this issue. He recently underwent a Dix-Hallpike maneuver.  He reports irregular bowel movements, describing them as 'IBS kind of stuff' with urgency occurring about 15 minutes after eating. He experiences abdominal pain or cramping associated with this but does not require treatment with medications like Imodium or antispasmodics. He denies seeing blood in his stool.  No numbness, weakness, tingling, dizziness, lightheadedness, major changes in weight, appetite, or bowel movements, and no night sweats or lymphadenopathy. He notes getting cold easily, which has been a long-standing issue.       Outpatient Medications Prior to Visit  Medication Sig Dispense Refill   aspirin EC 81 MG tablet Take 1 tablet (81 mg total) by mouth daily. Swallow whole. (Patient taking differently: Take 81 mg by mouth 3 (three) times a week. Swallow whole.) 90 tablet 1   Coenzyme Q10 (CO Q 10) 100 MG CAPS Take 1  capsule by mouth daily.     diazepam (VALIUM) 2 MG tablet Take 1 tablet (2 mg total) by mouth every 6 (six) hours as needed for anxiety 20 tablet 0   fluticasone (FLONASE) 50 MCG/ACT nasal spray Place 1 spray into both nostrils daily.     loratadine (CLARITIN) 10 MG tablet Take 10 mg by mouth daily.     rosuvastatin (CRESTOR) 10 MG tablet Take 1 tablet (10 mg total) by mouth daily. 90 tablet 3   triamterene-hydrochlorothiazide (DYAZIDE) 37.5-25 MG capsule Take 1 capsule by mouth daily. 30 capsule 5   levothyroxine (SYNTHROID) 100 MCG tablet Take 1 tablet (100 mcg total) by mouth daily before breakfast. 90 tablet 1   levothyroxine (SYNTHROID) 100 MCG tablet Take 1 tablet (100 mcg total) by mouth daily before breakfast. 90 tablet 1   triamterene-hydrochlorothiazide (DYAZIDE) 37.5-25 MG capsule Take 1 capsule by mouth daily. (Patient taking differently: Take 1 capsule by mouth as needed.) 30 capsule 5   rosuvastatin (CRESTOR) 5 MG tablet Take 1 tablet (5 mg total) by mouth daily. (Patient not taking: Reported on 02/12/2023) 90 tablet 3   No facility-administered medications prior to visit.    ROS Review of Systems  Constitutional: Negative.  Negative for diaphoresis and fatigue.  HENT: Negative.    Respiratory: Negative.  Negative for cough, chest tightness, shortness of breath and wheezing.   Cardiovascular:  Negative for chest pain, palpitations and leg swelling.  Gastrointestinal:  Positive for diarrhea. Negative for abdominal pain, blood in stool, constipation, nausea and vomiting.  Endocrine: Positive for cold intolerance. Negative for heat intolerance.  Genitourinary: Negative.   Musculoskeletal: Negative.  Negative for arthralgias and myalgias.  Skin: Negative.  Negative for color change.  Neurological: Negative.  Negative for dizziness and weakness.  Hematological:  Negative for adenopathy. Does not bruise/bleed easily.  Psychiatric/Behavioral: Negative.      Objective:  BP 118/82  (BP Location: Left Arm, Patient Position: Sitting, Cuff Size: Normal)   Pulse 66   Ht 5\' 9"  (1.753 m)   Wt 161 lb (73 kg)   SpO2 99%   BMI 23.78 kg/m   BP Readings from Last 3 Encounters:  02/12/23 118/82  10/20/22 128/82  08/09/22 120/80    Wt Readings from Last 3 Encounters:  02/12/23 161 lb (73 kg)  10/20/22 168 lb (76.2 kg)  08/09/22 168 lb (76.2 kg)    Physical Exam Vitals reviewed.  Constitutional:      Appearance: Normal appearance.  HENT:     Nose: Nose normal.     Mouth/Throat:     Mouth: Mucous membranes are moist.  Eyes:     General: No scleral icterus. Cardiovascular:     Rate and Rhythm: Normal rate and regular rhythm.     Pulses: Normal pulses.     Heart sounds: No murmur heard.    No friction rub. No gallop.  Pulmonary:     Effort: Pulmonary effort is normal.     Breath sounds: No stridor. No wheezing, rhonchi or rales.  Abdominal:     General: Abdomen is flat.     Palpations: There is no mass.     Tenderness: There is no abdominal tenderness. There is no guarding.     Hernia: No hernia is present.  Musculoskeletal:        General: Normal range of motion.     Cervical back: Neck supple.     Right lower leg: No edema.     Left lower leg: No edema.  Lymphadenopathy:     Cervical: No cervical adenopathy.  Skin:    General: Skin is warm and dry.  Neurological:     General: No focal deficit present.     Mental Status: He is alert and oriented to person, place, and time. Mental status is at baseline.  Psychiatric:        Mood and Affect: Mood normal.        Behavior: Behavior normal.     Lab Results  Component Value Date   WBC 5.3 02/12/2023   HGB 14.4 02/12/2023   HCT 42.5 02/12/2023   PLT 208.0 02/12/2023   GLUCOSE 94 02/12/2023   CHOL 115 02/12/2023   TRIG 86.0 02/12/2023   HDL 48.40 02/12/2023   LDLCALC 49 02/12/2023   ALT 12 02/12/2023   AST 18 02/12/2023   NA 140 02/12/2023   K 3.9 02/12/2023   CL 105 02/12/2023   CREATININE  1.21 02/12/2023   BUN 18 02/12/2023   CO2 28 02/12/2023   TSH 3.29 02/12/2023   PSA 1.68 08/09/2022   HGBA1C 5.8 04/12/2021   MICROALBUR <0.7 08/09/2022    CT CARDIAC CALCIUM SCORING (DOCTOR'S DAY ONLY) Addendum Date: 06/01/2020 ADDENDUM REPORT: 06/01/2020 09:09 EXAM: OVER-READ INTERPRETATION  CT CHEST The following report is an over-read performed by radiologist Dr. Marinda Elk [facility] on 06/01/2020. This over-read does not include interpretation of cardiac or coronary anatomy or pathology. The coronary calcium score interpretation by the cardiologist is attached. COMPARISON:  Prior calcium score study on 03/24/2019  FINDINGS: No significant noncardiac vascular findings. Visualized mediastinum and hilar regions are unremarkable. Visualized lungs show no evidence of pulmonary edema, consolidation, pneumothorax, nodule or pleural fluid. Visualized upper abdomen and bony structures are unremarkable. IMPRESSION: No significant incidental findings. Electronically Signed   By: Irish Lack M.D.   On: 06/01/2020 09:09   Result Date: 06/01/2020 CLINICAL DATA:  Cardiovascular Disease Risk stratification EXAM: Coronary Calcium Score TECHNIQUE: A gated, non-contrast computed tomography scan of the heart was performed using 3mm slice thickness. Axial images were analyzed on a dedicated workstation. Calcium scoring of the coronary arteries was performed using the Agatston method. FINDINGS: Coronary arteries: Normal origins. Coronary Calcium Score: Left main: 23.2 Left anterior descending artery: 1.53 Left circumflex artery: 0 Right coronary artery: 25.2 Total: 49.9 Percentile: 52nd Pericardium: Normal. Ascending Aorta: Normal caliber. Non-cardiac: See separate report from [facility]. IMPRESSION: Coronary calcium score of 49.9. This was 62nd percentile for age-, race-, and sex-matched controls. RECOMMENDATIONS: Coronary artery calcium (CAC) score is a strong predictor of incident  coronary heart disease (CHD) and provides predictive information beyond traditional risk factors. CAC scoring is reasonable to use in the decision to withhold, postpone, or initiate statin therapy in intermediate-risk or selected borderline-risk asymptomatic adults (age 56-75 years and LDL-C >=70 to <190 mg/dL) who do not have diabetes or established atherosclerotic cardiovascular disease (ASCVD).* In intermediate-risk (10-year ASCVD risk >=7.5% to <20%) adults or selected borderline-risk (10-year ASCVD risk >=5% to <7.5%) adults in whom a CAC score is measured for the purpose of making a treatment decision the following recommendations have been made: If CAC=0, it is reasonable to withhold statin therapy and reassess in 5 to 10 years, as long as higher risk conditions are absent (diabetes mellitus, family history of premature CHD in first degree relatives (males <55 years; females <65 years), cigarette smoking, or LDL >=190 mg/dL). If CAC is 1 to 99, it is reasonable to initiate statin therapy for patients >=84 years of age. If CAC is >=100 or >=75th percentile, it is reasonable to initiate statin therapy at any age. Cardiology referral should be considered for patients with CAC scores >=400 or >=75th percentile. *2018 AHA/ACC/AACVPR/AAPA/ABC/ACPM/ADA/AGS/APhA/ASPC/NLA/PCNA Guideline on the Management of Blood Cholesterol: A Report of the American College of Cardiology/American Heart Association Task Force on Clinical Practice Guidelines. J Am Coll Cardiol. 2019;73(24):3168-3209. Armanda Magic, MD Electronically Signed: By: Armanda Magic On: 05/31/2020 17:51    Assessment & Plan:   CKD (chronic kidney disease) stage 2, GFR 60-89 ml/min- Will avoid nephrotoxic agents. Renal function is stable -     CBC with Differential/Platelet; Future -     Urinalysis, Routine w reflex microscopic; Future -     Basic metabolic panel; Future  Acquired hypothyroidism- He is euthyroid. -     CBC with Differential/Platelet;  Future -     TSH; Future -     Levothyroxine Sodium; Take 1 tablet (100 mcg total) by mouth daily before breakfast.  Dispense: 90 tablet; Refill: 1  Dyslipidemia, goal LDL below 100 - LDL goal achieved. Doing well on the statin  -     Hepatic function panel; Future -     CK; Future -     Lipid panel; Future     Follow-up: Return in about 6 months (around 08/12/2023).  Sanda Linger, MD

## 2023-02-14 ENCOUNTER — Other Ambulatory Visit (HOSPITAL_COMMUNITY): Payer: Self-pay

## 2023-02-26 ENCOUNTER — Telehealth: Payer: Self-pay | Admitting: Internal Medicine

## 2023-02-26 NOTE — Telephone Encounter (Signed)
Copied from CRM [id number]. Topic: General - Billing Inquiry >> Feb 26, 2023  2:54 PM Tiffany H wrote: Reason for CRM: Patient called to advise that [facility] [employer] alerted him to a billing error made by the pre-authorization team. Due to CPT listed on orders 21308 - level 4 recheck, insurance will not cover. Please re-submit or educate patient.

## 2023-03-12 NOTE — Telephone Encounter (Unsigned)
 Copied from CRM [id number]. Topic: General - Billing Inquiry >> Mar 12, 2023  4:12 PM David Mclaughlin wrote: Reason for CRM: Patient called to advise that David Mclaughlin alerted him to a billing error made by the pre-authorization team. Due to CPT listed on orders [id number] - level 4 recheck, insurance will not cover pt's lab work. Please re-submit, back date and call pt at [phone].

## 2023-04-03 ENCOUNTER — Other Ambulatory Visit (HOSPITAL_COMMUNITY): Payer: Self-pay

## 2023-07-23 ENCOUNTER — Encounter: Payer: PRIVATE HEALTH INSURANCE | Admitting: Internal Medicine

## 2023-08-13 ENCOUNTER — Ambulatory Visit: Payer: Self-pay | Admitting: Internal Medicine

## 2023-08-13 ENCOUNTER — Ambulatory Visit (INDEPENDENT_AMBULATORY_CARE_PROVIDER_SITE_OTHER): Payer: PRIVATE HEALTH INSURANCE | Admitting: Internal Medicine

## 2023-08-13 ENCOUNTER — Encounter: Payer: Self-pay | Admitting: Internal Medicine

## 2023-08-13 ENCOUNTER — Other Ambulatory Visit (HOSPITAL_COMMUNITY): Payer: Self-pay

## 2023-08-13 VITALS — BP 136/98 | HR 72 | Temp 97.8°F | Resp 16 | Ht 69.0 in | Wt 167.2 lb

## 2023-08-13 DIAGNOSIS — Z0000 Encounter for general adult medical examination without abnormal findings: Secondary | ICD-10-CM

## 2023-08-13 DIAGNOSIS — N401 Enlarged prostate with lower urinary tract symptoms: Secondary | ICD-10-CM

## 2023-08-13 DIAGNOSIS — I10 Essential (primary) hypertension: Secondary | ICD-10-CM

## 2023-08-13 DIAGNOSIS — E039 Hypothyroidism, unspecified: Secondary | ICD-10-CM | POA: Diagnosis not present

## 2023-08-13 DIAGNOSIS — R351 Nocturia: Secondary | ICD-10-CM

## 2023-08-13 DIAGNOSIS — Z0001 Encounter for general adult medical examination with abnormal findings: Secondary | ICD-10-CM

## 2023-08-13 LAB — URINALYSIS, ROUTINE W REFLEX MICROSCOPIC
Bilirubin Urine: NEGATIVE
Ketones, ur: NEGATIVE
Leukocytes,Ua: NEGATIVE
Nitrite: NEGATIVE
Specific Gravity, Urine: <=1.005 — AB (ref 1.000–1.030)
Total Protein, Urine: NEGATIVE
Urine Glucose: NEGATIVE
Urobilinogen, UA: 0.2 (ref 0.0–1.0)
WBC, UA: NONE SEEN (ref 0–?)
pH: 6.5 (ref 5.0–8.0)

## 2023-08-13 LAB — BASIC METABOLIC PANEL WITH GFR
BUN: 17 mg/dL (ref 6–23)
CO2: 29 meq/L (ref 19–32)
Calcium: 9.3 mg/dL (ref 8.4–10.5)
Chloride: 103 meq/L (ref 96–112)
Creatinine, Ser: 1.11 mg/dL (ref 0.40–1.50)
GFR: 69.47 mL/min (ref >60.00)
Glucose, Bld: 94 mg/dL (ref 70–99)
Potassium: 4.3 meq/L (ref 3.5–5.1)
Sodium: 139 meq/L (ref 135–145)

## 2023-08-13 LAB — TSH: TSH: 3.55 u[IU]/mL (ref 0.35–5.50)

## 2023-08-13 LAB — PSA: PSA: 2.74 ng/mL (ref 0.10–4.00)

## 2023-08-13 MED ORDER — LEVOTHYROXINE SODIUM 100 MCG PO TABS
100.0000 ug | ORAL_TABLET | Freq: Every day | ORAL | 1 refills | Status: AC
  Filled 2023-08-13: qty 90, 90d supply, fill #0
  Filled 2023-11-12: qty 90, 90d supply, fill #1

## 2023-08-13 NOTE — Progress Notes (Addendum)
 Subjective:  Patient ID: David Mclaughlin, male    DOB: Aug 10, 1955  Age: 68 y.o. MRN: [id number]  CC: Annual Exam, Hypertension, Hyperlipidemia, and Hypothyroidism   HPI David Mclaughlin presents for a CPX and f/up ----  Discussed the use of AI scribe software for clinical note transcription with the patient, who gave verbal consent to proceed.  History of Present Illness David Mclaughlin is a 68 year old male with Meniere's disease who presents for a full physical exam.  He experiences ongoing issues with dizziness, which he describes as occasional. Avoiding sleeping on his right side helps mitigate the dizziness, although it is uncomfortable not being able to roll over onto that side.  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 Sometimes it is 'really intense' and at other times it seems to disappear, only to return a few hours later. He recalls noticing the tinnitus at 4 AM this morning.  He wakes up to urinate in the middle of the night approximately three times a week, while on other nights he sleeps through without issues.  He is physically active, capable of performing 25 to 30 pushups and running up three flights of stairs without difficulty. He denies chest pain, shortness of breath, and palpitations.    Outpatient Medications Prior to Visit  Medication Sig Dispense Refill   aspirin  EC 81 MG tablet Take 1 tablet (81 mg total) by mouth daily. Swallow whole. 90 tablet 1   Coenzyme Q10 (CO Q 10) 100 MG CAPS Take 1 capsule by mouth daily.     diazepam  (VALIUM ) 2 MG tablet Take 1 tablet (2 mg total) by mouth every 6 (six) hours as needed for anxiety 20 tablet 0   fluticasone  (FLONASE ) 50 MCG/ACT nasal spray Place 1 spray into both nostrils daily.     loratadine (CLARITIN) 10 MG tablet Take 10 mg by mouth daily.     rosuvastatin  (CRESTOR ) 10 MG tablet Take 1 tablet (10 mg total) by mouth daily. 90 tablet 3   triamterene -hydrochlorothiazide  (DYAZIDE )  37.5-25 MG capsule Take 1 capsule by mouth daily. 30 capsule 5   levothyroxine  (SYNTHROID ) 100 MCG tablet Take 1 tablet (100 mcg total) by mouth daily before breakfast. 90 tablet 1   No facility-administered medications prior to visit.    ROS Review of Systems  Constitutional:  Negative for appetite change, chills, diaphoresis, fatigue and fever.  HENT: Negative.    Eyes: Negative.   Respiratory: Negative.  Negative for cough, chest tightness, shortness of breath and wheezing.   Cardiovascular:  Negative for chest pain, palpitations and leg swelling.  Gastrointestinal:  Positive for diarrhea. Negative for abdominal pain, blood in stool, constipation, nausea and vomiting.       Chronic diarrhea with a negative work-up  Endocrine: Negative.  Negative for cold intolerance and heat intolerance.  Genitourinary: Negative.  Negative for difficulty urinating and dysuria.  Musculoskeletal: Negative.  Negative for arthralgias and myalgias.  Skin: Negative.   Neurological: Negative.  Negative for dizziness, weakness and light-headedness.  Hematological:  Negative for adenopathy. Does not bruise/bleed easily.  Psychiatric/Behavioral: Negative.      Objective:  BP (!) 136/98 (BP Location: Left Arm, Patient Position: Sitting, Cuff Size: Normal) Comment: BP (R) 138/86  Pulse 72   Temp 97.8 F (36.6 C) (Oral)   Resp 16   Ht 5' 9 (1.753 m)   Wt 167 lb 3.2 oz (75.8 kg)   SpO2 98%   BMI 24.69 kg/m  BP Readings from Last 3 Encounters:  08/13/23 (!) 136/98  02/12/23 118/82  10/20/22 128/82    Wt Readings from Last 3 Encounters:  08/13/23 167 lb 3.2 oz (75.8 kg)  02/12/23 161 lb (73 kg)  10/20/22 168 lb (76.2 kg)    Physical Exam Vitals reviewed.  Constitutional:      Appearance: Normal appearance.  HENT:     Nose: Nose normal.     Mouth/Throat:     Mouth: Mucous membranes are moist.  Eyes:     General: No scleral icterus.    Conjunctiva/sclera: Conjunctivae normal.   Cardiovascular:     Rate and Rhythm: Normal rate and regular rhythm.     Pulses: Normal pulses.     Heart sounds: No murmur heard.    No friction rub. No gallop.     Comments: EKG--- NSR, 61 bpm No LVH, Q waves, or ST/T wave changes  Pulmonary:     Effort: Pulmonary effort is normal.     Breath sounds: No stridor. No wheezing, rhonchi or rales.  Abdominal:     General: Abdomen is flat.     Palpations: There is no mass.     Tenderness: There is no abdominal tenderness. There is no guarding.     Hernia: No hernia is present. There is no hernia in the left inguinal area or right inguinal area.  Genitourinary:    Pubic Area: No rash.      Penis: Normal and circumcised.      Testes: Normal.     Epididymis:     Right: Normal.     Left: Normal.     Prostate: Enlarged. Not tender and no nodules present.     Rectum: Normal. Guaiac result negative. No mass, tenderness, anal fissure, external hemorrhoid or internal hemorrhoid. Normal anal tone.  Musculoskeletal:     Cervical back: Neck supple.     Right lower leg: No edema.     Left lower leg: No edema.  Lymphadenopathy:     Cervical: No cervical adenopathy.     Lower Body: No right inguinal adenopathy. No left inguinal adenopathy.  Skin:    General: Skin is warm and dry.  Neurological:     General: No focal deficit present.     Mental Status: He is alert and oriented to person, place, and time.  Psychiatric:        Mood and Affect: Mood normal.        Behavior: Behavior normal.     Lab Results  Component Value Date   WBC 5.3 02/12/2023   HGB 14.4 02/12/2023   HCT 42.5 02/12/2023   PLT 208.0 02/12/2023   GLUCOSE 94 08/13/2023   CHOL 115 02/12/2023   TRIG 86.0 02/12/2023   HDL 48.40 02/12/2023   LDLCALC 49 02/12/2023   ALT 12 02/12/2023   AST 18 02/12/2023   NA 139 08/13/2023   K 4.3 08/13/2023   CL 103 08/13/2023   CREATININE 1.11 08/13/2023   BUN 17 08/13/2023   CO2 29 08/13/2023   TSH 3.55 08/13/2023   PSA 2.74  08/13/2023   HGBA1C 5.8 04/12/2021    CT CARDIAC CALCIUM  SCORING (DOCTOR'S DAY ONLY) Addendum Date: 06/01/2020 ADDENDUM REPORT: 06/01/2020 09:09 EXAM: OVER-READ INTERPRETATION  CT CHEST The following report is an over-read performed by radiologist Dr. Marcey Diones [facility] on 06/01/2020. This over-read does not include interpretation of cardiac or coronary anatomy or pathology. The coronary calcium  score interpretation by the cardiologist is attached.  COMPARISON:  Prior calcium  score study on 03/24/2019 FINDINGS: No significant noncardiac vascular findings. Visualized mediastinum and hilar regions are unremarkable. Visualized lungs show no evidence of pulmonary edema, consolidation, pneumothorax, nodule or pleural fluid. Visualized upper abdomen and bony structures are unremarkable. IMPRESSION: No significant incidental findings. Electronically Signed   By: Marcey Moan M.D.   On: 06/01/2020 09:09   Result Date: 06/01/2020 CLINICAL DATA:  Cardiovascular Disease Risk stratification EXAM: Coronary Calcium  Score TECHNIQUE: A gated, non-contrast computed tomography scan of the heart was performed using 3mm slice thickness. Axial images were analyzed on a dedicated workstation. Calcium  scoring of the coronary arteries was performed using the Agatston method. FINDINGS: Coronary arteries: Normal origins. Coronary Calcium  Score: Left main: 23.2 Left anterior descending artery: 1.53 Left circumflex artery: 0 Right coronary artery: 25.2 Total: 49.9 Percentile: 52nd Pericardium: Normal. Ascending Aorta: Normal caliber. Non-cardiac: See separate report from [facility]. IMPRESSION: Coronary calcium  score of 49.9. This was 62nd percentile for age-, race-, and sex-matched controls. RECOMMENDATIONS: Coronary artery calcium  (CAC) score is a strong predictor of incident coronary heart disease (CHD) and provides predictive information beyond traditional risk factors. CAC scoring is reasonable to  use in the decision to withhold, postpone, or initiate statin therapy in intermediate-risk or selected borderline-risk asymptomatic adults (age 64-75 years and LDL-C >=70 to <190 mg/dL) who do not have diabetes or established atherosclerotic cardiovascular disease (ASCVD).* In intermediate-risk (10-year ASCVD risk >=7.5% to <20%) adults or selected borderline-risk (10-year ASCVD risk >=5% to <7.5%) adults in whom a CAC score is measured for the purpose of making a treatment decision the following recommendations have been made: If CAC=0, it is reasonable to withhold statin therapy and reassess in 5 to 10 years, as long as higher risk conditions are absent (diabetes mellitus, family history of premature CHD in first degree relatives (males <55 years; females <65 years), cigarette smoking, or LDL >=190 mg/dL). If CAC is 1 to 99, it is reasonable to initiate statin therapy for patients >=76 years of age. If CAC is >=100 or >=75th percentile, it is reasonable to initiate statin therapy at any age. Cardiology referral should be considered for patients with CAC scores >=400 or >=75th percentile. *2018 AHA/ACC/AACVPR/AAPA/ABC/ACPM/ADA/AGS/APhA/ASPC/NLA/PCNA Guideline on the Management of Blood Cholesterol: A Report of the American College of Cardiology/American Heart Association Task Force on Clinical Practice Guidelines. J Am Coll Cardiol. 2019;73(24):3168-3209. Wilbert Bihari, MD Electronically Signed: By: Wilbert Bihari On: 05/31/2020 17:51    Assessment & Plan:  Benign prostatic hyperplasia with nocturia -     PSA; Future -     Urinalysis, Routine w reflex microscopic; Future  Acquired hypothyroidism- He is euthyroid. -     TSH; Future -     Levothyroxine  Sodium; Take 1 tablet (100 mcg total) by mouth daily before breakfast.  Dispense: 90 tablet; Refill: 1  Hypertension, unspecified type -     EKG 12-Lead  Combined systolic and diastolic hypertension- EKG is negative for LVH. This is likely white coat HTN.  His wife is a Engineer, civil (consulting). She will monitor his BP. -     Basic metabolic panel with GFR; Future  Encounter for general adult medical examination with abnormal findings- Exam completed, labs reviewed, vaccines reviewed, cancer screenings are UTD, pt ed material was given.      Follow-up: Return in about 3 months (around 11/13/2023).  Debby Molt, MD

## 2023-08-13 NOTE — Patient Instructions (Signed)
 Health Maintenance, Male  Adopting a healthy lifestyle and getting preventive care are important in promoting health and wellness. Ask your health care provider about:  The right schedule for you to have regular tests and exams.  Things you can do on your own to prevent diseases and keep yourself healthy.  What should I know about diet, weight, and exercise?  Eat a healthy diet    Eat a diet that includes plenty of vegetables, fruits, low-fat dairy products, and lean protein.  Do not eat a lot of foods that are high in solid fats, added sugars, or sodium.  Maintain a healthy weight  Body mass index (BMI) is a measurement that can be used to identify possible weight problems. It estimates body fat based on height and weight. Your health care provider can help determine your BMI and help you achieve or maintain a healthy weight.  Get regular exercise  Get regular exercise. This is one of the most important things you can do for your health. Most adults should:  Exercise for at least 150 minutes each week. The exercise should increase your heart rate and make you sweat (moderate-intensity exercise).  Do strengthening exercises at least twice a week. This is in addition to the moderate-intensity exercise.  Spend less time sitting. Even light physical activity can be beneficial.  Watch cholesterol and blood lipids  Have your blood tested for lipids and cholesterol at 66 years of age, then have this test every 5 years.  You may need to have your cholesterol levels checked more often if:  Your lipid or cholesterol levels are high.  You are older than 66 years of age.  You are at high risk for heart disease.  What should I know about cancer screening?  Many types of cancers can be detected early and may often be prevented. Depending on your health history and family history, you may need to have cancer screening at various ages. This may include screening for:  Colorectal cancer.  Prostate cancer.  Skin cancer.  Lung  cancer.  What should I know about heart disease, diabetes, and high blood pressure?  Blood pressure and heart disease  High blood pressure causes heart disease and increases the risk of stroke. This is more likely to develop in people who have high blood pressure readings or are overweight.  Talk with your health care provider about your target blood pressure readings.  Have your blood pressure checked:  Every 3-5 years if you are 9-95 years of age.  Every year if you are 85 years old or older.  If you are between the ages of 29 and 29 and are a current or former smoker, ask your health care provider if you should have a one-time screening for abdominal aortic aneurysm (AAA).  Diabetes  Have regular diabetes screenings. This checks your fasting blood sugar level. Have the screening done:  Once every three years after age 23 if you are at a normal weight and have a low risk for diabetes.  More often and at a younger age if you are overweight or have a high risk for diabetes.  What should I know about preventing infection?  Hepatitis B  If you have a higher risk for hepatitis B, you should be screened for this virus. Talk with your health care provider to find out if you are at risk for hepatitis B infection.  Hepatitis C  Blood testing is recommended for:  Everyone born from [birth year] through 1963.  Anyone  with known risk factors for hepatitis C.  Sexually transmitted infections (STIs)  You should be screened each year for STIs, including gonorrhea and chlamydia, if:  You are sexually active and are younger than 66 years of age.  You are older than 66 years of age and your health care provider tells you that you are at risk for this type of infection.  Your sexual activity has changed since you were last screened, and you are at increased risk for chlamydia or gonorrhea. Ask your health care provider if you are at risk.  Ask your health care provider about whether you are at high risk for HIV. Your health care provider  may recommend a prescription medicine to help prevent HIV infection. If you choose to take medicine to prevent HIV, you should first get tested for HIV. You should then be tested every 3 months for as long as you are taking the medicine.  Follow these instructions at home:  Alcohol use  Do not drink alcohol if your health care provider tells you not to drink.  If you drink alcohol:  Limit how much you have to 0-2 drinks a day.  Know how much alcohol is in your drink. In the [place] one drink equals one 12 oz bottle of beer (355 mL), one 5 oz glass of wine (148 mL), or one 1 oz glass of hard liquor (44 mL).  Lifestyle  Do not use any products that contain nicotine or tobacco. These products include cigarettes, chewing tobacco, and vaping devices, such as e-cigarettes. If you need help quitting, ask your health care provider.  Do not use street drugs.  Do not share needles.  Ask your health care provider for help if you need support or information about quitting drugs.  General instructions  Schedule regular health, dental, and eye exams.  Stay current with your vaccines.  Tell your health care provider if:  You often feel depressed.  You have ever been abused or do not feel safe at home.  Summary  Adopting a healthy lifestyle and getting preventive care are important in promoting health and wellness.  Follow your health care provider's instructions about healthy diet, exercising, and getting tested or screened for diseases.  Follow your health care provider's instructions on monitoring your cholesterol and blood pressure.  This information is not intended to replace advice given to you by your health care provider. Make sure you discuss any questions you have with your health care provider.  Document Revised: 05/17/2020 Document Reviewed: 05/17/2020  Elsevier Patient Education  2024 [employer].

## 2023-08-14 ENCOUNTER — Other Ambulatory Visit (HOSPITAL_COMMUNITY): Payer: Self-pay

## 2023-08-18 DIAGNOSIS — I10 Essential (primary) hypertension: Secondary | ICD-10-CM | POA: Insufficient documentation

## 2023-08-21 ENCOUNTER — Telehealth: Payer: Self-pay

## 2023-08-21 NOTE — Telephone Encounter (Signed)
 Copied from CRM [id number]. Topic: Clinical - Medication Question >> Aug 21, 2023 11:10 AM Rea BROCKS wrote: Reason for CRM: Patient has been self-monitoring Blood Pressure per Dr. Joshua request. Patient would indeed like for Dr. Joshua to put in a script for blood pressure meds as they have discussed.   [phone]

## 2023-08-22 ENCOUNTER — Other Ambulatory Visit (HOSPITAL_COMMUNITY): Payer: Self-pay

## 2023-08-22 ENCOUNTER — Other Ambulatory Visit: Payer: Self-pay | Admitting: Internal Medicine

## 2023-08-22 DIAGNOSIS — I10 Essential (primary) hypertension: Secondary | ICD-10-CM

## 2023-08-22 MED ORDER — AMLODIPINE BESYLATE 5 MG PO TABS
5.0000 mg | ORAL_TABLET | Freq: Every day | ORAL | 1 refills | Status: DC
  Filled 2023-08-22 (×2): qty 90, 90d supply, fill #0
  Filled 2024-01-11: qty 90, 90d supply, fill #1

## 2023-08-22 MED ORDER — OLMESARTAN MEDOXOMIL 20 MG PO TABS
20.0000 mg | ORAL_TABLET | Freq: Every day | ORAL | 1 refills | Status: DC
  Filled 2023-08-22: qty 90, 90d supply, fill #0

## 2023-08-22 NOTE — Telephone Encounter (Signed)
 Patient has been made aware.

## 2023-08-22 NOTE — Telephone Encounter (Signed)
 Rx sent.

## 2023-08-22 NOTE — Telephone Encounter (Signed)
 Patient states that his readings throughout the day has been 113/82-144/96. Please advise.

## 2023-09-05 ENCOUNTER — Emergency Department (HOSPITAL_BASED_OUTPATIENT_CLINIC_OR_DEPARTMENT_OTHER)
Admission: EM | Admit: 2023-09-05 | Discharge: 2023-09-05 | Disposition: A | Discharge status: Home / Self Care | Attending: Emergency Medicine | Admitting: Emergency Medicine

## 2023-09-05 ENCOUNTER — Ambulatory Visit
Admission: EM | Admit: 2023-09-05 | Discharge: 2023-09-05 | Disposition: A | Discharge status: Other Acute Inpatient Hospital | Attending: Family Medicine | Admitting: Family Medicine

## 2023-09-05 ENCOUNTER — Other Ambulatory Visit (HOSPITAL_COMMUNITY): Payer: Self-pay

## 2023-09-05 ENCOUNTER — Encounter (HOSPITAL_BASED_OUTPATIENT_CLINIC_OR_DEPARTMENT_OTHER): Payer: Self-pay

## 2023-09-05 ENCOUNTER — Other Ambulatory Visit: Payer: Self-pay

## 2023-09-05 DIAGNOSIS — N189 Chronic kidney disease, unspecified: Secondary | ICD-10-CM | POA: Diagnosis not present

## 2023-09-05 DIAGNOSIS — T7840XA Allergy, unspecified, initial encounter: Secondary | ICD-10-CM | POA: Diagnosis not present

## 2023-09-05 DIAGNOSIS — T782XXA Anaphylactic shock, unspecified, initial encounter: Secondary | ICD-10-CM | POA: Insufficient documentation

## 2023-09-05 DIAGNOSIS — I10 Essential (primary) hypertension: Secondary | ICD-10-CM | POA: Diagnosis not present

## 2023-09-05 DIAGNOSIS — T63484A Toxic effect of venom of other arthropod, undetermined, initial encounter: Secondary | ICD-10-CM | POA: Diagnosis not present

## 2023-09-05 DIAGNOSIS — Z79899 Other long term (current) drug therapy: Secondary | ICD-10-CM | POA: Diagnosis not present

## 2023-09-05 DIAGNOSIS — R220 Localized swelling, mass and lump, head: Secondary | ICD-10-CM

## 2023-09-05 DIAGNOSIS — E039 Hypothyroidism, unspecified: Secondary | ICD-10-CM | POA: Diagnosis not present

## 2023-09-05 DIAGNOSIS — I129 Hypertensive chronic kidney disease with stage 1 through stage 4 chronic kidney disease, or unspecified chronic kidney disease: Secondary | ICD-10-CM | POA: Insufficient documentation

## 2023-09-05 DIAGNOSIS — Z7982 Long term (current) use of aspirin: Secondary | ICD-10-CM | POA: Diagnosis not present

## 2023-09-05 MED ORDER — EPINEPHRINE 0.3 MG/0.3ML IJ SOAJ
0.3000 mg | INTRAMUSCULAR | 0 refills | Status: AC | PRN
Start: 2023-09-05 — End: ?
  Filled 2023-09-05: qty 2, 15d supply, fill #0

## 2023-09-05 MED ORDER — FAMOTIDINE 20 MG PO TABS
20.0000 mg | ORAL_TABLET | Freq: Every day | ORAL | 0 refills | Status: DC
  Filled 2023-09-05: qty 5, 5d supply, fill #0

## 2023-09-05 MED ORDER — EPINEPHRINE PF 1 MG/ML IJ SOLN
0.3000 mg | Freq: Once | INTRAMUSCULAR | Status: AC
  Administered 2023-09-05: 0.3 mg via SUBCUTANEOUS

## 2023-09-05 MED ORDER — DIPHENHYDRAMINE HCL 25 MG PO TABS
25.0000 mg | ORAL_TABLET | Freq: Four times a day (QID) | ORAL | 0 refills | Status: AC | PRN
  Filled 2023-09-05: qty 30, 8d supply, fill #0

## 2023-09-05 MED ORDER — METHYLPREDNISOLONE SODIUM SUCC 125 MG IJ SOLR
125.0000 mg | Freq: Once | INTRAMUSCULAR | Status: AC
  Administered 2023-09-05: 125 mg via INTRAVENOUS

## 2023-09-05 MED ORDER — METHYLPREDNISOLONE 4 MG PO TBPK
ORAL_TABLET | ORAL | 0 refills | Status: DC
  Filled 2023-09-05: qty 21, 6d supply, fill #0

## 2023-09-05 NOTE — ED Provider Notes (Addendum)
 [facility]    CSN: [id number] Arrival date & time: 09/05/23  1356      History   Chief Complaint No chief complaint on file.   HPI David Mclaughlin is a 68 y.o. male presents for anaphylaxis.  Patient states he was stung by a bee on the back of the scalp, under his arm, and his left lower leg 30 minutes prior to arrival.  He took 3 Advil, 1 Pepcid  and 1 Xyzal prior to arrival.  He states he drove himself here which is about 30 minutes away and reports shortness of breath and tongue swelling/throat swelling.  No chest pain or facial swelling.  Has a history of allergic reactions to bee stings in the past.  HPI  Past Medical History:  Diagnosis Date   BPPV (benign paroxysmal positional vertigo)    Cavernous angioma 1996   COVID-19 06/2020   Dizziness    Gallstones 2016   GERD (gastroesophageal reflux disease)    Bertrum syndrome    Hx of adenomatous polyp of colon 03/2020   Hypercholesteremia    Hypothyroidism    IBS (irritable bowel syndrome)    Palpitations    Pneumonia 07/2020   After COVID   PVC's (premature ventricular contractions)    positional   Shulman's syndrome    eosinophilic fasciitis    Patient Active Problem List   Diagnosis Date Noted   Hypertension 08/18/2023   Benign prostatic hyperplasia with nocturia 08/13/2023   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 Authorizing Provider  amLODipine  (NORVASC ) 5 MG tablet Take 1 tablet (5 mg total) by mouth daily. 08/22/23   Joshua Debby CROME, MD  aspirin  EC 81 MG tablet Take 1 tablet (81 mg total) by mouth daily. Swallow whole. 08/13/22   Joshua Debby CROME, MD  Coenzyme Q10 (CO Q 10) 100 MG CAPS Take 1 capsule by mouth daily.    [provider]  diazepam  (VALIUM ) 2 MG tablet Take 1 tablet (2 mg total) by mouth every 6 (six) hours as needed for anxiety 10/19/21     fluticasone  (FLONASE ) 50 MCG/ACT nasal spray Place 1 spray into both nostrils daily.    [provider]  levothyroxine  (SYNTHROID ) 100 MCG tablet Take 1 tablet (100 mcg total) by mouth daily before breakfast. 08/13/23   Joshua Debby CROME, MD  loratadine (CLARITIN) 10 MG tablet Take 10 mg by mouth daily.    [provider]  olmesartan  (BENICAR ) 20 MG tablet Take 1 tablet (20 mg total) by mouth daily. 08/22/23   Joshua Debby CROME, MD  rosuvastatin  (CRESTOR ) 10 MG tablet Take 1 tablet (10 mg total) by mouth daily. 10/20/22 10/02/23  [place], Peter M, MD  triamterene -hydrochlorothiazide  (DYAZIDE ) 37.5-25 MG capsule Take 1 capsule by mouth daily. 12/27/22       Family History Family History  Problem Relation Age of Onset   Hypertension Mother    Parkinson's disease Father    Colon polyps Sister    Bladder Cancer Brother    Heart disease Other    Hypertension Other    Heart attack Other    Esophageal cancer Neg Hx    Stomach cancer Neg Hx    Rectal cancer Neg Hx     Social History Social History   Tobacco Use   Smoking status: Never   Smokeless tobacco: Never  Substance Use Topics   Alcohol  use: No   Drug use: No     Allergies   Bee venom   Review of Systems Review of Systems  HENT:         Tongue and throat swelling after bee sting  Respiratory:  Positive for shortness of breath.      Physical Exam Triage Vital Signs ED Triage Vitals [09/05/23 1359]  Encounter  Vitals Group     BP (!) 162/102     Girls Systolic BP Percentile      Girls Diastolic BP Percentile      Boys Systolic BP Percentile      Boys Diastolic BP Percentile      Pulse Rate 100     Resp      Temp      Temp src      SpO2 99 %     Weight      Height      Head Circumference      Peak Flow      Pain Score      Pain Loc      Pain Education      Exclude from Growth Chart    No data found.  Updated Vital Signs BP (!) 162/102 (BP Location: Right Arm)   Pulse 100   SpO2 99%   Visual Acuity Right Eye Distance:   Left Eye Distance:   Bilateral Distance:    Right Eye Near:   Left Eye Near:    Bilateral Near:     Physical Exam Vitals and nursing note reviewed.  Constitutional:      General: He is not in acute distress.    Appearance: Normal appearance. He is not ill-appearing.  HENT:     Head: Normocephalic and atraumatic.     Mouth/Throat:     Lips: Pink.     Mouth: Mucous membranes are moist. No angioedema.     Pharynx: Uvula midline. No uvula swelling.     Comments: Left-sided tongue swelling with mild throat swelling Eyes:     Conjunctiva/sclera: Conjunctivae normal.     Pupils: Pupils are equal, round, and reactive to light.  Cardiovascular:     Rate and Rhythm: Normal rate and regular rhythm.     Heart sounds: Normal heart sounds.  Pulmonary:     Breath sounds: Normal breath sounds. No wheezing or rhonchi.     Comments: Tachypnea Skin:    General: Skin is warm and dry.  Neurological:     General: No focal deficit present.     Mental Status: He is alert and oriented to person, place, and time.  Psychiatric:        Mood and Affect: Mood normal.        Behavior: Behavior normal.      UC Treatments / Results  Labs (all labs ordered are listed, but only abnormal results are displayed) Labs Reviewed - No data to display  EKG   Radiology  No results found.  Procedures Procedures (including critical care time)  Medications Ordered in  UC Medications  EPINEPHrine  (ADRENALIN ) 0.3 mg (0.3 mg Subcutaneous Given 09/05/23 1400)  methylPREDNISolone  sodium succinate (SOLU-MEDROL ) 125 mg/2 mL injection 125 mg (125 mg Intravenous Given 09/05/23 1409)    Initial Impression / Assessment and Plan / UC Course  I have reviewed the triage vital signs and the nursing notes.  Pertinent labs & imaging results that were available during my care of the patient were reviewed by me and considered in my medical decision making (see chart for details).     Patient given EpiPen  and IV Solu-Medrol .  Patient had already taken Xyzal 30 minutes prior to arrival.  Was monitored closely and EMS arrived and patient was taken to ER for further treatment/monitoring. Final Clinical Impressions(s) / UC Diagnoses   Final diagnoses:  Allergic reaction, initial encounter  Tongue swelling     Discharge Instructions      Patient taken via EMS to emergency room     ED Prescriptions   None    PDMP not reviewed this encounter.   Loreda Myla SAUNDERS, NP 09/05/23 1416    Loreda Myla SAUNDERS, NP 09/05/23 [phone]

## 2023-09-05 NOTE — ED Triage Notes (Signed)
 Pt present to UC with allergic reaction to bee sting. Pt c/o tongue swelling and throat closing. Present with SOB. Medical Provider notified.

## 2023-09-05 NOTE — Discharge Instructions (Signed)
 It was a pleasure taking care of you today.  As discussed, you were treated for an allergic reaction.  I am sending you home with steroids, Pepcid , and Benadryl .  Take for the next 5 days.  I have also refilled your EpiPen .  Return to the ER for any worsening symptoms.

## 2023-09-05 NOTE — ED Triage Notes (Addendum)
 Pt BIB EMS from UC for anaphylaxis to 2 bee stings 2 hours pta. Developed urticaria, shortness of breath, tongue swelling. Improved after meds.  Epi (given at 1400), IM 125mg  solumedrol, given at UC, IV benadryl  50mg  w/ EMS Took pepcid  20mg , Xyzol, advil at home

## 2023-09-05 NOTE — ED Notes (Signed)
 Pt currently in no distress, and has no complaints. Nausea has resolved since EMS intevention. No tongue swelling, pt feels completely back to normal. No pains, aches, SOB, dizziness, weakness, itching, swelling, or diaphoresis noted.

## 2023-09-05 NOTE — ED Provider Notes (Signed)
 I provided a substantive portion of the care of this patient.  I personally made/approved the management plan for this patient and take responsibility for the patient management.     Patient to bee stings 1 on the side of the face and 1 on the back of his head.  He started to develop swelling of the tongue and difficulty breathing.  Patient had no prior allergy to bee stings.  Patient presented to urgent care and was given IM Solu-Medrol  and epinephrine  with improvement of symptoms.  He reports at this time he is not having any further oral swelling or difficulty breathing.  He still has of swelling at the posterior scalp that is painful.  Patient is alert nontoxic.  No respiratory distress.  No visible facial swelling.  No oral swelling.  Lungs clear.  Patient continues to have a moderate-sized: Nodule of the posterior scalp.  I agree with plan of management.   Armenta Canning, MD 09/05/23 1539

## 2023-09-05 NOTE — ED Notes (Signed)
 Patient is being discharged from the Urgent Care and sent to the Emergency Department via [facility] . Per Myla Bold, NP, patient is in need of higher level of care due to anaphylaxis. Patient is aware and verbalizes understanding of plan of care.  Vitals:   09/05/23 1359  BP: (!) 162/102  Pulse: 100  SpO2: 99%

## 2023-09-05 NOTE — ED Notes (Signed)
 Pt states feels SOB and tingling all over

## 2023-09-05 NOTE — Discharge Instructions (Addendum)
 Patient taken via EMS to emergency room

## 2023-09-05 NOTE — ED Notes (Signed)

## 2023-09-05 NOTE — ED Provider Notes (Signed)
 [place] EMERGENCY DEPARTMENT AT [facility] Provider Note   CSN: [id number] Arrival date & time: 09/05/23  1451     Patient presents with: Allergic Reaction   David Mclaughlin is a 68 y.o. male with a past medical history significant for hypothyroidism, CKD, hypertension, and IBS who presents to the ED due to allergic reaction.  Patient coming from urgent care due to concerns about anaphylaxis after being stung by bee roughly 3 times.  Patient received epinephrine  at urgent care in addition to Solu-Medrol .  Prior to epinephrine , patient admitted to shortness of breath, tongue swelling, and some pruritus.  Never developed any hives per patient.  Denies nausea and vomiting.  No abdominal pain. Previous anaphylaxis to bee stings. No other allergies. Prior to UC, patient took 20mg  pepcid , Xyzal, and advil. Currently complains of scalp pain around sting.   History obtained from patient and past medical records. No interpreter used during encounter.       Prior to Admission medications   Medication Sig Start Date End Date Taking? Authorizing Provider  diphenhydrAMINE  (BENADRYL ) 25 MG tablet Take 1 tablet (25 mg total) by mouth every 6 (six) hours as needed. 09/05/23  Yes Lorelle Aleck BROCKS, PA-C  EPINEPHrine  0.3 mg/0.3 mL IJ SOAJ injection Inject 0.3 mg into the muscle as needed for anaphylaxis. 09/05/23  Yes Boyde Grieco C, PA-C  famotidine  (PEPCID ) 20 MG tablet Take 1 tablet (20 mg total) by mouth daily for 5 days. 09/05/23 09/10/23 Yes Jaydien Panepinto, Aleck BROCKS, PA-C  methylPREDNISolone  (MEDROL  DOSEPAK) 4 MG TBPK tablet Use as directed 09/05/23  Yes Anaijah Augsburger, Aleck BROCKS, PA-C  amLODipine  (NORVASC ) 5 MG tablet Take 1 tablet (5 mg total) by mouth daily. 08/22/23   Joshua Debby CROME, MD  aspirin  EC 81 MG tablet Take 1 tablet (81 mg total) by mouth daily. Swallow whole. 08/13/22   Joshua Debby CROME, MD  Coenzyme Q10 (CO Q 10) 100 MG CAPS Take 1 capsule by mouth daily.    [provider]   diazepam  (VALIUM ) 2 MG tablet Take 1 tablet (2 mg total) by mouth every 6 (six) hours as needed for anxiety 10/19/21     fluticasone  (FLONASE ) 50 MCG/ACT nasal spray Place 1 spray into both nostrils daily.    [provider]  levothyroxine  (SYNTHROID ) 100 MCG tablet Take 1 tablet (100 mcg total) by mouth daily before breakfast. 08/13/23   Joshua Debby CROME, MD  loratadine (CLARITIN) 10 MG tablet Take 10 mg by mouth daily.    [provider]  olmesartan  (BENICAR ) 20 MG tablet Take 1 tablet (20 mg total) by mouth daily. 08/22/23   Joshua Debby CROME, MD  rosuvastatin  (CRESTOR ) 10 MG tablet Take 1 tablet (10 mg total) by mouth daily. 10/20/22 10/02/23  [place], Peter M, MD  triamterene -hydrochlorothiazide  (DYAZIDE ) 37.5-25 MG capsule Take 1 capsule by mouth daily. 12/27/22       Allergies: Bee venom    Review of Systems  HENT:  Positive for facial swelling (resolved).   Respiratory:  Positive for shortness of breath (resolved).   Gastrointestinal:  Negative for nausea and vomiting.  Skin:  Positive for color change.    Updated Vital Signs BP 126/86   Pulse 64   Temp 97.6 F (36.4 C) (Oral)   Resp 18   SpO2 97%   Physical Exam Vitals and nursing note reviewed.  Constitutional:      General: He is not in acute distress.    Appearance: He is not ill-appearing.  HENT:     Head: Normocephalic.     Mouth/Throat:     Comments: Airway patent Eyes:     Pupils: Pupils are equal, round, and reactive to light.  Cardiovascular:     Rate and Rhythm: Normal rate and regular rhythm.     Pulses: Normal pulses.     Heart sounds: Normal heart sounds. No murmur heard.    No friction rub. No gallop.  Pulmonary:     Effort: Pulmonary effort is normal.     Breath sounds: Normal breath sounds.     Comments: Respirations equal and unlabored, patient able to speak in full sentences, lungs clear to auscultation bilaterally Abdominal:     General: Abdomen is flat. There is no distension.      Palpations: Abdomen is soft.     Tenderness: There is no abdominal tenderness. There is no guarding or rebound.  Musculoskeletal:        General: Normal range of motion.     Cervical back: Neck supple.  Skin:    General: Skin is warm and dry.  Neurological:     General: No focal deficit present.     Mental Status: He is alert.  Psychiatric:        Mood and Affect: Mood normal.        Behavior: Behavior normal.     (all labs ordered are listed, but only abnormal results are displayed) Labs Reviewed - No data to display  EKG: None  Radiology: No results found.   Procedures   Medications Ordered in the ED - No data to display  Clinical Course as of 09/05/23 1803  Wed Sep 05, 2023  1520 Reassessed patient at bedside.  Lungs clear to auscultation bilaterally.  No stridor or wheeze.  Normal phonation.  Airway patent. [CA]  1544 Reassessed patient at bedside.  Patient asleep.  Wife notes patient is very sensitive to Benadryl .  Lungs clear to auscultation bilaterally. [CA]  [id number] Reassessed patient at bedside.  Patient awake.  Lungs clear to auscultation bilaterally.  Will continue to observe. [CA]  1802 Reassessed patient at bedside.  Lungs clear to auscultation bilaterally.  No stridor or wheeze.  Airway patent. [CA]    Clinical Course User Index [CA] Lorelle Aleck BROCKS, PA-C                                 Medical Decision Making Amount and/or Complexity of Data Reviewed Independent Historian: EMS External Data Reviewed: notes.    Details: UC note  Risk OTC drugs. Prescription drug management.   This patient presents to the ED for concern of SOB, this involves an extensive number of treatment options, and is a complaint that carries with it a high risk of complications and morbidity.  The differential diagnosis includes anaphylaxis, cardiac etiology, infection, etc  68 year old male presents to the ED due to concerns about anaphylaxis after being stung by a bee.   Patient arrives via EMS from urgent care where he received epinephrine , Solu-Medrol , and Benadryl .  Prior to urgent care presentation patient took 20 mg of Pepcid , Xyzal, and Advil.  Patient initially complained of shortness of breath, pruritus, and tongue swelling which have resolved during initial evaluation.  Upon arrival, patient afebrile, not tachycardic or hypoxic.  Patient in no acute distress.  Airway patent.  Lungs clear to auscultation bilaterally.  No stridor or wheeze.  No rash.  Will continue to observe  for 4 hours after epinephrine . Discussed with Dr. Armenta who evaluated patient at bedside and agrees with assessment and plan.  Patient reassessed numerous times as noted above.  Patient observed for 4 hours with no further signs of anaphylaxis.  Patient discharged with steroids, Pepcid , Benadryl .  Refilled EpiPen .  Patient stable for discharge. Strict ED precautions discussed with patient. Patient states understanding and agrees to plan. Patient discharged home in no acute distress and stable vitals  Has PCP Hx CKD    Final diagnoses:  Anaphylaxis, initial encounter    ED Discharge Orders          Ordered    methylPREDNISolone  (MEDROL  DOSEPAK) 4 MG TBPK tablet        09/05/23 1801    famotidine  (PEPCID ) 20 MG tablet  Daily        09/05/23 1801    diphenhydrAMINE  (BENADRYL ) 25 MG tablet  Every 6 hours PRN        09/05/23 1801    EPINEPHrine  0.3 mg/0.3 mL IJ SOAJ injection  As needed        09/05/23 1801               Lorelle Aleck JAYSON DEVONNA 09/05/23 1804    Armenta Canning, MD 09/06/23 1918

## 2023-09-17 DIAGNOSIS — R972 Elevated prostate specific antigen [PSA]: Secondary | ICD-10-CM | POA: Diagnosis not present

## 2023-09-17 DIAGNOSIS — H8110 Benign paroxysmal vertigo, unspecified ear: Secondary | ICD-10-CM | POA: Diagnosis not present

## 2023-09-17 DIAGNOSIS — E039 Hypothyroidism, unspecified: Secondary | ICD-10-CM | POA: Diagnosis not present

## 2023-09-17 DIAGNOSIS — I10 Essential (primary) hypertension: Secondary | ICD-10-CM | POA: Diagnosis not present

## 2023-09-17 DIAGNOSIS — E785 Hyperlipidemia, unspecified: Secondary | ICD-10-CM | POA: Diagnosis not present

## 2023-09-28 ENCOUNTER — Other Ambulatory Visit (HOSPITAL_COMMUNITY): Payer: Self-pay | Admitting: Nurse Practitioner

## 2023-09-28 DIAGNOSIS — E785 Hyperlipidemia, unspecified: Secondary | ICD-10-CM

## 2023-09-28 DIAGNOSIS — I10 Essential (primary) hypertension: Secondary | ICD-10-CM

## 2023-10-01 ENCOUNTER — Other Ambulatory Visit (HOSPITAL_COMMUNITY): Payer: Self-pay

## 2023-10-03 ENCOUNTER — Other Ambulatory Visit (HOSPITAL_COMMUNITY): Payer: Self-pay

## 2023-10-03 MED ORDER — FLUZONE HIGH-DOSE 0.5 ML IM SUSY
0.5000 mL | PREFILLED_SYRINGE | Freq: Once | INTRAMUSCULAR | 0 refills | Status: AC
  Filled 2023-10-03: qty 0.5, 1d supply, fill #0

## 2023-10-11 DIAGNOSIS — T162XXA Foreign body in left ear, initial encounter: Secondary | ICD-10-CM | POA: Diagnosis not present
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
## 2023-10-22 ENCOUNTER — Ambulatory Visit (HOSPITAL_COMMUNITY)
Admission: RE | Admit: 2023-10-22 | Discharge: 2023-10-22 | Disposition: A | Discharge status: Home / Self Care | Source: Ambulatory Visit | Attending: Nurse Practitioner | Admitting: Nurse Practitioner

## 2023-10-22 DIAGNOSIS — E785 Hyperlipidemia, unspecified: Secondary | ICD-10-CM | POA: Diagnosis not present

## 2023-10-22 DIAGNOSIS — I10 Essential (primary) hypertension: Secondary | ICD-10-CM

## 2023-10-22 LAB — ECHOCARDIOGRAM COMPLETE
AV Mean grad: 3 mmHg
AV Peak grad: 5.2 mmHg
Ao pk vel: 1.14 m/s
Area-P 1/2: 4.89 cm2
MV M vel: 4.72 m/s
MV Peak grad: 89.1 mmHg
S' Lateral: 2.82 cm

## 2023-10-26 NOTE — Progress Notes (Unsigned)
 Cardiology Office Note:    Date:  10/30/2023   ID:  David Mclaughlin, DOB May 02, 1955, MRN [id number]  PCP:  Rolinda Millman, MD   [facility] Providers Cardiologist:  None     Referring MD: No ref. provider found   Chief Complaint  Patient presents with   Coronary artery calcification   Follow-up    History of Present Illness:    David Mclaughlin is a 68 y.o. male seen to for follow up coronary calcification. Normal Myoview  in 2004. Stress Echo in 2010 was normal. Noted mild MV prolapse and mild MR. Coronary calcium  score in May 2022 was 50. He is on statin for lipids and ASA.   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 Noted BP elevated in August. Took BP at home and it was up and down. He is very active and eats healthy. Did start on amlodipine  with normalization of BP.   Past Medical History:  Diagnosis Date   BPPV (benign paroxysmal positional vertigo)    Cavernous angioma 1996   COVID-19 06/2020   Dizziness    Gallstones 2016   GERD (gastroesophageal reflux disease)    Bertrum syndrome    Hx of adenomatous polyp of colon 03/2020   Hypercholesteremia    Hypothyroidism    IBS (irritable bowel syndrome)    Palpitations    Pneumonia 07/2020   After COVID   PVC's (premature ventricular contractions)    positional   Shulman's syndrome    eosinophilic fasciitis    Past Surgical History:  Procedure Laterality Date   BACK SURGERY  1996 & 1999   CHOLECYSTECTOMY     COLONOSCOPY      Current Medications: Current Meds  Medication Sig   amLODipine  (NORVASC ) 5 MG tablet Take 1 tablet (5 mg total) by mouth daily.   aspirin  EC 81 MG tablet Take 1 tablet (81 mg total) by mouth daily. Swallow whole.   Coenzyme Q10 (CO Q 10) 100 MG CAPS Take 1 capsule by mouth daily.   diazepam  (VALIUM ) 2 MG tablet Take 1 tablet (2 mg total) by mouth every 6 (six) hours as needed for anxiety   diphenhydrAMINE  (BENADRYL ) 25 MG tablet Take 1 tablet (25 mg total) by mouth  every 6 (six) hours as needed.   EPINEPHrine  0.3 mg/0.3 mL IJ SOAJ injection Inject 0.3 mg into the muscle as needed for severe allergic reaction.   fluticasone  (FLONASE ) 50 MCG/ACT nasal spray Place 1 spray into both nostrils daily.   levothyroxine  (SYNTHROID ) 100 MCG tablet Take 1 tablet (100 mcg total) by mouth daily before breakfast.   loratadine (CLARITIN) 10 MG tablet Take 10 mg by mouth daily.   rosuvastatin  (CRESTOR ) 10 MG tablet Take 1 tablet (10 mg total) by mouth daily.   [DISCONTINUED] triamterene -hydrochlorothiazide  (DYAZIDE ) 37.5-25 MG capsule Take 1 capsule by mouth daily.     Allergies:   Bee venom   Social History   Socioeconomic History   Marital status: Married    Spouse name: Not on file   Number of children: 3   Years of education: Not on file   Highest education level: Master's degree (e.g., MA, MS, MEng, MEd, MSW, MBA)  Occupational History   Occupation: PA    Employer: [employer]  Tobacco Use   Smoking status: Never   Smokeless tobacco: Never  Substance and Sexual Activity   Alcohol  use: No   Drug use: No   Sexual activity: Yes  Partners: Female  Other Topics Concern   Not on file  Social History Narrative   Married 3 kids   Advice worker for [employer]   Occasional alcohol , never smoker, no drug use   Social Drivers of Corporate investment banker Strain: Low Risk  (08/08/2022)   Overall Financial Resource Strain (CARDIA)    Difficulty of Paying Living Expenses: Not hard at all  Food Insecurity: No Food Insecurity (08/08/2022)   Hunger Vital Sign    Worried About Running Out of Food in the Last Year: Never true    Ran Out of Food in the Last Year: Never true  Transportation Needs: No Transportation Needs (08/08/2022)   PRAPARE - Administrator, Civil Service (Medical): No    Lack of Transportation (Non-Medical): No  Physical Activity: Insufficiently Active (08/08/2022)   Exercise Vital Sign    Days of  Exercise per Week: 3 days    Minutes of Exercise per Session: 30 min  Stress: Stress Concern Present (08/08/2022)   [employer] of Occupational Health - Occupational Stress Questionnaire    Feeling of Stress : To some extent  Social Connections: Moderately Integrated (08/08/2022)   Social Connection and Isolation Panel    Frequency of Communication with Friends and Family: More than three times a week    Frequency of Social Gatherings with Friends and Family: Once a week    Attends Religious Services: Never    Database administrator or Organizations: Yes    Attends Engineer, structural: More than 4 times per year    Marital Status: Married     Family History: The patient's family history includes Bladder Cancer in his brother; Colon polyps in his sister; Heart attack in an other family member; Heart disease in an other family member; Hypertension in his mother and another family member; Parkinson's disease in his father. There is no history of Esophageal cancer, Stomach cancer, or Rectal cancer.  ROS:   Please see the history of present illness.     All other systems reviewed and are negative.  EKGs/Labs/Other Studies Reviewed:    The following studies were reviewed today:          Recent Labs: 02/12/2023: ALT 12; Hemoglobin 14.4; Platelets 208.0 08/13/2023: BUN 17; Creatinine, Ser 1.11; Potassium 4.3; Sodium 139; TSH 3.55  Recent Lipid Panel    Component Value Date/Time   CHOL 115 02/12/2023 0828   TRIG 86.0 02/12/2023 0828   HDL 48.40 02/12/2023 0828   CHOLHDL 2 02/12/2023 0828   VLDL 17.2 02/12/2023 0828   LDLCALC 49 02/12/2023 0828     Risk Assessment/Calculations:          Echo 10/22/23: IMPRESSIONS     1. Left ventricular ejection fraction, by estimation, is 60 to 65%. The  left ventricle has normal function. The left ventricle has no regional  wall motion abnormalities. Left ventricular diastolic parameters were  normal.   2. Right ventricular  systolic function is normal. The right ventricular  size is normal.   3. The mitral valve is normal in structure. Mild mitral valve  regurgitation. No evidence of mitral stenosis.   4. The aortic valve is tricuspid. Aortic valve regurgitation is not  visualized. No aortic stenosis is present.   5. The inferior vena cava is normal in size with greater than 50%  respiratory variability, suggesting right atrial pressure of 3 mmHg.        Physical Exam:  VS:  BP 118/78 (BP Location: Right Arm, Patient Position: Sitting, Cuff Size: Normal)   Pulse 62   Resp 16   Ht 5' 9 (1.753 m)   Wt 158 lb (71.7 kg)   SpO2 99%   BMI 23.33 kg/m     Wt Readings from Last 3 Encounters:  10/30/23 158 lb (71.7 kg)  08/13/23 167 lb 3.2 oz (75.8 kg)  02/12/23 161 lb (73 kg)     GEN:  Well nourished, well developed in no acute distress HEENT: Normal NECK: No JVD; No carotid bruits LYMPHATICS: No lymphadenopathy CARDIAC: RRR, no murmurs, rubs, gallops RESPIRATORY:  Clear to auscultation without rales, wheezing or rhonchi  ABDOMEN: Soft, non-tender, non-distended MUSCULOSKELETAL:  No edema; No deformity  SKIN: Warm and dry NEUROLOGIC:  Alert and oriented x 3 PSYCHIATRIC:  Normal affect   ASSESSMENT:    1. Hypertension, unspecified type   2. Coronary artery calcification   3. Hyperlipidemia, mild     PLAN:    In order of problems listed above:  Coronary artery calcification. No symptoms of angina. Focus on risk factor modification. Taking ASA. LDL is 49 on statin.  Continue risk factor modification HLD.  He does have elevated lipoprotein (a) at 229. LDL 49 at goal on 10 mg Crestor  History of mild MV prolapse. No murmur or click on exam. Recent Echo was normal.  HTN. Now on amlodipine  with excellent response. Discussed concerns he has of secondary causes which I think is unlikely.      Follow up in one year      Medication Adjustments/Labs and Tests Ordered: Current medicines are  reviewed at length with the patient today.  Concerns regarding medicines are outlined above.  No orders of the defined types were placed in this encounter.  Meds ordered this encounter  Medications   triamterene -hydrochlorothiazide  (DYAZIDE ) 37.5-25 MG capsule    Sig: Take 1 each (1 capsule total) by mouth daily as needed (for vertigo).    Take 1 capsule by mouth daily.    There are no Patient Instructions on file for this visit.   Signed, Winfrey Chillemi [place], MD  10/30/2023 9:35 AM    [facility]

## 2023-10-30 ENCOUNTER — Encounter: Payer: Self-pay | Admitting: Cardiology

## 2023-10-30 ENCOUNTER — Ambulatory Visit: Attending: Cardiology | Admitting: Cardiology

## 2023-10-30 VITALS — BP 118/78 | HR 62 | Resp 16 | Ht 69.0 in | Wt 158.0 lb

## 2023-10-30 DIAGNOSIS — I2510 Atherosclerotic heart disease of native coronary artery without angina pectoris: Secondary | ICD-10-CM | POA: Diagnosis not present

## 2023-10-30 DIAGNOSIS — I10 Essential (primary) hypertension: Secondary | ICD-10-CM | POA: Diagnosis not present

## 2023-10-30 DIAGNOSIS — E785 Hyperlipidemia, unspecified: Secondary | ICD-10-CM | POA: Diagnosis not present

## 2023-10-30 MED ORDER — TRIAMTERENE-HCTZ 37.5-25 MG PO CAPS: 1.0000 | ORAL_CAPSULE | Freq: Every day | ORAL | Status: AC | PRN

## 2023-10-30 NOTE — Patient Instructions (Signed)

## 2023-11-27 DIAGNOSIS — D225 Melanocytic nevi of trunk: Secondary | ICD-10-CM | POA: Diagnosis not present

## 2023-11-27 DIAGNOSIS — L820 Inflamed seborrheic keratosis: Secondary | ICD-10-CM | POA: Diagnosis not present

## 2023-11-27 DIAGNOSIS — Z1283 Encounter for screening for malignant neoplasm of skin: Secondary | ICD-10-CM | POA: Diagnosis not present

## 2023-12-17 ENCOUNTER — Other Ambulatory Visit: Payer: Self-pay | Admitting: Cardiology

## 2023-12-18 ENCOUNTER — Other Ambulatory Visit (HOSPITAL_COMMUNITY): Payer: Self-pay

## 2023-12-18 MED ORDER — ROSUVASTATIN CALCIUM 10 MG PO TABS
10.0000 mg | ORAL_TABLET | Freq: Every day | ORAL | 3 refills | Status: AC
  Filled 2023-12-18 – 2024-02-12 (×2): qty 90, 90d supply, fill #0

## 2024-02-06 ENCOUNTER — Encounter: Payer: Self-pay | Admitting: Pharmacist

## 2024-02-06 ENCOUNTER — Other Ambulatory Visit (HOSPITAL_COMMUNITY): Payer: Self-pay

## 2024-02-06 ENCOUNTER — Other Ambulatory Visit: Payer: Self-pay | Admitting: Internal Medicine

## 2024-02-06 ENCOUNTER — Other Ambulatory Visit: Payer: Self-pay

## 2024-02-06 DIAGNOSIS — E039 Hypothyroidism, unspecified: Secondary | ICD-10-CM

## 2024-02-06 MED ORDER — LEVOTHYROXINE SODIUM 100 MCG PO TABS
100.0000 ug | ORAL_TABLET | Freq: Every morning | ORAL | 3 refills | Status: AC
  Filled 2024-02-06: qty 30, 30d supply, fill #0

## 2024-02-11 ENCOUNTER — Other Ambulatory Visit (HOSPITAL_COMMUNITY): Payer: Self-pay

## 2024-02-11 ENCOUNTER — Other Ambulatory Visit: Payer: Self-pay

## 2024-02-11 MED ORDER — LEVOTHYROXINE SODIUM 100 MCG PO TABS
100.0000 ug | ORAL_TABLET | ORAL | 1 refills | Status: AC
  Filled 2024-02-11 – 2024-02-12 (×2): qty 90, 90d supply, fill #0

## 2024-02-12 ENCOUNTER — Other Ambulatory Visit: Payer: Self-pay | Admitting: Internal Medicine

## 2024-02-12 ENCOUNTER — Other Ambulatory Visit (HOSPITAL_COMMUNITY): Payer: Self-pay

## 2024-02-12 ENCOUNTER — Other Ambulatory Visit (HOSPITAL_BASED_OUTPATIENT_CLINIC_OR_DEPARTMENT_OTHER): Payer: Self-pay

## 2024-02-12 DIAGNOSIS — I10 Essential (primary) hypertension: Secondary | ICD-10-CM

## 2024-02-12 MED ORDER — AMLODIPINE BESYLATE 5 MG PO TABS
5.0000 mg | ORAL_TABLET | Freq: Every day | ORAL | 0 refills | Status: AC
  Filled 2024-02-12: qty 90, 90d supply, fill #0

## 2024-02-13 ENCOUNTER — Other Ambulatory Visit (HOSPITAL_COMMUNITY): Payer: Self-pay
